# Patient Record
Sex: Female | Born: 1940 | Race: White | Hispanic: No | Smoking: Never smoker
Health system: Southern US, Community
[De-identification: ages and names within clinical notes are randomized; demographics above are authoritative.]

## PROBLEM LIST (undated history)

## (undated) DIAGNOSIS — E785 Hyperlipidemia, unspecified: Secondary | ICD-10-CM

## (undated) DIAGNOSIS — M4800 Spinal stenosis, site unspecified: Secondary | ICD-10-CM

## (undated) DIAGNOSIS — I2510 Atherosclerotic heart disease of native coronary artery without angina pectoris: Secondary | ICD-10-CM

## (undated) DIAGNOSIS — I10 Essential (primary) hypertension: Secondary | ICD-10-CM

## (undated) DIAGNOSIS — E079 Disorder of thyroid, unspecified: Secondary | ICD-10-CM

## (undated) HISTORY — DX: Disorder of thyroid, unspecified: E07.9

## (undated) HISTORY — DX: Hyperlipidemia, unspecified: E78.5

## (undated) HISTORY — PX: THYROID SURGERY: SHX805

## (undated) HISTORY — PX: TUBAL LIGATION: SHX77

## (undated) HISTORY — DX: Atherosclerotic heart disease of native coronary artery without angina pectoris: I25.10

## (undated) HISTORY — DX: Essential (primary) hypertension: I10

## (undated) HISTORY — DX: Spinal stenosis, site unspecified: M48.00

---

## 1999-07-15 ENCOUNTER — Encounter: Admission: RE | Admit: 1999-07-15 | Discharge: 1999-07-15 | Payer: Self-pay | Admitting: Obstetrics and Gynecology

## 1999-07-15 ENCOUNTER — Encounter: Payer: Self-pay | Admitting: Obstetrics and Gynecology

## 2000-07-23 ENCOUNTER — Encounter: Payer: Self-pay | Admitting: Obstetrics and Gynecology

## 2000-07-23 ENCOUNTER — Encounter: Admission: RE | Admit: 2000-07-23 | Discharge: 2000-07-23 | Payer: Self-pay | Admitting: Obstetrics and Gynecology

## 2001-08-30 ENCOUNTER — Encounter: Admission: RE | Admit: 2001-08-30 | Discharge: 2001-08-30 | Payer: Self-pay | Admitting: Obstetrics and Gynecology

## 2001-08-30 ENCOUNTER — Encounter: Payer: Self-pay | Admitting: Obstetrics and Gynecology

## 2002-03-07 ENCOUNTER — Other Ambulatory Visit: Admission: RE | Admit: 2002-03-07 | Discharge: 2002-03-07 | Payer: Self-pay | Admitting: Obstetrics and Gynecology

## 2002-06-27 ENCOUNTER — Encounter: Admission: RE | Admit: 2002-06-27 | Discharge: 2002-09-25 | Payer: Self-pay | Admitting: Family Medicine

## 2002-09-30 ENCOUNTER — Encounter: Admission: RE | Admit: 2002-09-30 | Discharge: 2002-12-29 | Payer: Self-pay | Admitting: Family Medicine

## 2002-10-11 ENCOUNTER — Encounter: Admission: RE | Admit: 2002-10-11 | Discharge: 2002-10-11 | Payer: Self-pay | Admitting: Family Medicine

## 2002-10-11 ENCOUNTER — Encounter: Payer: Self-pay | Admitting: Family Medicine

## 2003-01-25 ENCOUNTER — Encounter: Admission: RE | Admit: 2003-01-25 | Discharge: 2003-04-25 | Payer: Self-pay | Admitting: Family Medicine

## 2003-03-01 ENCOUNTER — Other Ambulatory Visit: Admission: RE | Admit: 2003-03-01 | Discharge: 2003-03-01 | Payer: Self-pay | Admitting: Obstetrics and Gynecology

## 2003-03-03 ENCOUNTER — Ambulatory Visit (HOSPITAL_COMMUNITY): Admission: RE | Admit: 2003-03-03 | Discharge: 2003-03-03 | Payer: Self-pay | Admitting: Obstetrics and Gynecology

## 2003-04-03 ENCOUNTER — Encounter (INDEPENDENT_AMBULATORY_CARE_PROVIDER_SITE_OTHER): Payer: Self-pay | Admitting: *Deleted

## 2003-04-03 ENCOUNTER — Ambulatory Visit (HOSPITAL_COMMUNITY): Admission: RE | Admit: 2003-04-03 | Discharge: 2003-04-03 | Payer: Self-pay | Admitting: Surgery

## 2003-04-06 ENCOUNTER — Ambulatory Visit (HOSPITAL_BASED_OUTPATIENT_CLINIC_OR_DEPARTMENT_OTHER): Admission: RE | Admit: 2003-04-06 | Discharge: 2003-04-06 | Payer: Self-pay | Admitting: Orthopedic Surgery

## 2003-04-06 ENCOUNTER — Ambulatory Visit (HOSPITAL_COMMUNITY): Admission: RE | Admit: 2003-04-06 | Discharge: 2003-04-06 | Payer: Self-pay | Admitting: Orthopedic Surgery

## 2003-05-17 ENCOUNTER — Encounter: Admission: RE | Admit: 2003-05-17 | Discharge: 2003-08-15 | Payer: Self-pay | Admitting: Family Medicine

## 2003-06-01 ENCOUNTER — Observation Stay (HOSPITAL_COMMUNITY): Admission: RE | Admit: 2003-06-01 | Discharge: 2003-06-02 | Payer: Self-pay | Admitting: Orthopaedic Surgery

## 2003-06-01 ENCOUNTER — Encounter (INDEPENDENT_AMBULATORY_CARE_PROVIDER_SITE_OTHER): Payer: Self-pay | Admitting: *Deleted

## 2003-08-16 ENCOUNTER — Encounter: Admission: RE | Admit: 2003-08-16 | Discharge: 2003-11-14 | Payer: Self-pay | Admitting: Family Medicine

## 2003-11-30 ENCOUNTER — Encounter: Admission: RE | Admit: 2003-11-30 | Discharge: 2004-02-28 | Payer: Self-pay | Admitting: Family Medicine

## 2004-05-01 ENCOUNTER — Encounter: Admission: RE | Admit: 2004-05-01 | Discharge: 2004-07-30 | Payer: Self-pay | Admitting: Family Medicine

## 2004-05-13 ENCOUNTER — Other Ambulatory Visit: Admission: RE | Admit: 2004-05-13 | Discharge: 2004-05-13 | Payer: Self-pay | Admitting: Obstetrics and Gynecology

## 2004-11-11 ENCOUNTER — Ambulatory Visit (HOSPITAL_COMMUNITY): Admission: RE | Admit: 2004-11-11 | Discharge: 2004-11-11 | Payer: Self-pay | Admitting: Gastroenterology

## 2005-03-11 ENCOUNTER — Encounter: Admission: RE | Admit: 2005-03-11 | Discharge: 2005-06-09 | Payer: Self-pay | Admitting: Family Medicine

## 2005-05-29 ENCOUNTER — Other Ambulatory Visit: Admission: RE | Admit: 2005-05-29 | Discharge: 2005-05-29 | Payer: Self-pay | Admitting: Obstetrics and Gynecology

## 2006-03-05 ENCOUNTER — Ambulatory Visit: Payer: Self-pay | Admitting: Family Medicine

## 2006-03-12 ENCOUNTER — Ambulatory Visit: Payer: Self-pay | Admitting: Family Medicine

## 2006-03-26 ENCOUNTER — Ambulatory Visit: Payer: Self-pay | Admitting: Family Medicine

## 2006-04-09 ENCOUNTER — Ambulatory Visit: Payer: Self-pay | Admitting: Family Medicine

## 2006-04-23 ENCOUNTER — Ambulatory Visit: Payer: Self-pay | Admitting: Family Medicine

## 2006-05-07 ENCOUNTER — Ambulatory Visit: Payer: Self-pay | Admitting: Family Medicine

## 2006-05-21 ENCOUNTER — Ambulatory Visit: Payer: Self-pay | Admitting: Family Medicine

## 2006-06-04 ENCOUNTER — Ambulatory Visit: Payer: Self-pay | Admitting: Family Medicine

## 2006-07-02 ENCOUNTER — Ambulatory Visit: Payer: Self-pay | Admitting: Family Medicine

## 2006-07-30 ENCOUNTER — Ambulatory Visit: Payer: Self-pay | Admitting: Family Medicine

## 2006-09-03 ENCOUNTER — Ambulatory Visit: Payer: Self-pay | Admitting: Family Medicine

## 2006-09-11 ENCOUNTER — Encounter: Payer: Self-pay | Admitting: Family Medicine

## 2006-10-01 ENCOUNTER — Ambulatory Visit: Payer: Self-pay | Admitting: Family Medicine

## 2006-11-09 LAB — CONVERTED CEMR LAB
AST: 19 units/L (ref 0–37)
Albumin: 4.4 g/dL (ref 3.5–5.2)
Alkaline Phosphatase: 56 units/L (ref 39–117)
BUN: 20 mg/dL (ref 6–23)
Potassium: 4.7 meq/L (ref 3.5–5.3)
Sodium: 139 meq/L (ref 135–145)
Total Protein: 7.1 g/dL (ref 6.0–8.3)

## 2006-11-24 ENCOUNTER — Ambulatory Visit: Payer: Self-pay | Admitting: Family Medicine

## 2006-12-22 ENCOUNTER — Ambulatory Visit: Payer: Self-pay | Admitting: Family Medicine

## 2007-01-26 ENCOUNTER — Ambulatory Visit: Payer: Self-pay | Admitting: Family Medicine

## 2007-02-16 ENCOUNTER — Ambulatory Visit: Payer: Self-pay | Admitting: Family Medicine

## 2007-03-03 ENCOUNTER — Encounter: Payer: Self-pay | Admitting: Family Medicine

## 2007-03-16 ENCOUNTER — Ambulatory Visit: Payer: Self-pay | Admitting: Family Medicine

## 2007-04-13 ENCOUNTER — Ambulatory Visit: Payer: Self-pay | Admitting: Family Medicine

## 2007-07-13 ENCOUNTER — Ambulatory Visit: Payer: Self-pay | Admitting: Family Medicine

## 2007-09-28 ENCOUNTER — Ambulatory Visit: Payer: Self-pay | Admitting: Family Medicine

## 2007-11-17 ENCOUNTER — Ambulatory Visit: Payer: Self-pay | Admitting: Vascular Surgery

## 2007-11-23 ENCOUNTER — Ambulatory Visit: Payer: Self-pay | Admitting: Family Medicine

## 2008-02-16 ENCOUNTER — Ambulatory Visit: Payer: Self-pay | Admitting: Vascular Surgery

## 2008-02-29 ENCOUNTER — Ambulatory Visit: Payer: Self-pay | Admitting: Family Medicine

## 2008-03-08 ENCOUNTER — Ambulatory Visit: Payer: Self-pay | Admitting: Vascular Surgery

## 2008-03-13 ENCOUNTER — Ambulatory Visit: Payer: Self-pay | Admitting: Vascular Surgery

## 2008-08-16 ENCOUNTER — Ambulatory Visit: Payer: Self-pay | Admitting: Family Medicine

## 2008-09-20 ENCOUNTER — Ambulatory Visit: Payer: Self-pay | Admitting: Family Medicine

## 2008-10-12 ENCOUNTER — Ambulatory Visit (HOSPITAL_COMMUNITY): Admission: RE | Admit: 2008-10-12 | Discharge: 2008-10-12 | Payer: Self-pay | Admitting: Obstetrics and Gynecology

## 2008-10-12 ENCOUNTER — Encounter (INDEPENDENT_AMBULATORY_CARE_PROVIDER_SITE_OTHER): Payer: Self-pay | Admitting: Obstetrics and Gynecology

## 2010-06-10 ENCOUNTER — Other Ambulatory Visit: Payer: Self-pay | Admitting: Orthopaedic Surgery

## 2010-06-10 DIAGNOSIS — M549 Dorsalgia, unspecified: Secondary | ICD-10-CM

## 2010-06-12 ENCOUNTER — Ambulatory Visit
Admission: RE | Admit: 2010-06-12 | Discharge: 2010-06-12 | Disposition: A | Discharge status: Home / Self Care | Payer: Medicare Other | Source: Ambulatory Visit | Attending: Orthopaedic Surgery | Admitting: Orthopaedic Surgery

## 2010-06-12 DIAGNOSIS — M549 Dorsalgia, unspecified: Secondary | ICD-10-CM

## 2010-06-30 LAB — CBC
Hemoglobin: 13 g/dL (ref 12.0–15.0)
MCHC: 34.6 g/dL (ref 30.0–36.0)
RDW: 12.3 % (ref 11.5–15.5)

## 2010-07-17 ENCOUNTER — Other Ambulatory Visit: Payer: Self-pay | Admitting: Obstetrics and Gynecology

## 2010-07-17 DIAGNOSIS — N6314 Unspecified lump in the right breast, lower inner quadrant: Secondary | ICD-10-CM

## 2010-07-18 DIAGNOSIS — E669 Obesity, unspecified: Secondary | ICD-10-CM

## 2010-07-24 ENCOUNTER — Ambulatory Visit
Admission: RE | Admit: 2010-07-24 | Discharge: 2010-07-24 | Disposition: A | Discharge status: Home / Self Care | Payer: Medicare Other | Source: Ambulatory Visit | Attending: Obstetrics and Gynecology | Admitting: Obstetrics and Gynecology

## 2010-07-24 ENCOUNTER — Other Ambulatory Visit: Payer: Self-pay | Admitting: Obstetrics and Gynecology

## 2010-07-24 DIAGNOSIS — N6314 Unspecified lump in the right breast, lower inner quadrant: Secondary | ICD-10-CM

## 2010-08-06 NOTE — Consult Note (Signed)
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  She is well-known to me from her long time work as a  Engineer, civil (consulting) in the [employer]  She reports that she is having  increasingly severe discomfort over her left calf and pain and aching  sensation in her left calf and distal ankle.  She reports she elevates  her legs when possible.  She has not worn compression garments.  She has  not had any history of bleeding from her varicosities and does not have  any history of deep venous thrombosis or superficial thrombophlebitis.  She reports that the pain and swelling make it difficult for her to walk  which she likes to do for exercise and also causes difficulty in  prolonged standing and driving a car.  She reports this has been  somewhat worse in the past several months.  She does have a family  history of varicosities in her father.  She does have a history of  parathyroidectomy in 2005 and anterior cervical fusion in 2005.   MEDICATIONS:  Are Keflex and Butazolidin.   PHYSICAL EXAMINATION:  General:  A well-developed, well-nourished white  female appearing her stated age of 74.  Vital signs:  Blood pressure is  153/91, pulse 64, respirations 18.  Extremities:  Her dorsalis pedis  pulse are 2+ bilaterally.  She does have no evidence of varicosities on  her right leg with some spider vein telangiectasia at the medial knee.  On the left she does have marked tributary varicosities in her medial  calf.   She underwent handheld screening duplex by me which shows reflux in her  saphenous vein extending into these varicosities.  I discussed options  with the patient.  She has not worn compression garments in the past and  we have fitted her today with these thigh-high 20-30 mmHg garments.  She  will  continue with this and we will see her again in 3 months to  determine if elevation of her legs, ibuprofen for discomfort and  compression garments are helping her.  I did discuss the option of laser  ablation and stab phlebectomy for symptom relief should she fail  conservative treatment.  We will discuss this with her further in 3  months.   Larina Earthly, M.D.  Electronically Signed   TFE/MEDQ  D:  11/17/2007  T:  11/18/2007  Job:  [id number]

## 2010-08-06 NOTE — Assessment & Plan Note (Signed)
OFFICE VISIT   Melinda, Pittman  DOB:  [birth year]                                       02/16/2008  ZOXWR#:[id number]   The patient presents today for continued discussion regarding her left  leg venous pathology.  She reports that she has had no improvement from  wearing her graduated compression garments since my last visit with her  in August of 2009.  She works as a Engineer, civil (consulting) with long shifts and does a  great deal of standing with this.  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  She also reports this  impacts her ability to add walking in aerobic activity due to leg pain.  Her physical exam is noted for marked large varicosities throughout her  left calf.  She underwent a formal venous duplex today and this does  show reflux in her great saphenous vein and also her small saphenous  vein.  The deep system is without DVT or reflux.  The majority of these  varicosities appear to be arising directly from the small saphenous  vein.  I discussed this at length with the patient.  I have suggested  that we proceed with laser ablation of her left small saphenous vein and  stab phlebectomy of her multiple tributary varicosities.  I explained  the procedure including expected recovery.  I did explain the potential  complications of deep venous thrombosis and non-closure of the lesser  saphenous vein.  She understands and wishes to proceed as soon as we can  get her on the schedule.   Larina Earthly, M.D.  Electronically Signed   TFE/MEDQ  D:  02/16/2008  T:  02/21/2008  Job:  [id number]

## 2010-08-06 NOTE — H&P (Signed)
NAMEFRIMY, UFFELMAN NO.:  [id number]   MEDICAL RECORD NO.:  [id number]          PATIENT TYPE:  AMB   LOCATION:  SDC                           FACILITY:  [facility]   PHYSICIAN:  Juluis Mire, M.D.   DATE OF BIRTH:  08-25-1940   DATE OF ADMISSION:  DATE OF DISCHARGE:                              HISTORY & PHYSICAL   The patient is a 70 year old postmenopausal patient, who presents for  hysteroscopy.  The patient did re-experience some past postmenopausal  bleeding.  We did a saline infusion ultrasound.  She had endometrial  thickness of 5.8-mm.  There were no adnexal masses noted because of  extremely stenotic cervix, we actually could not complete the saline  infusion ultrasound or endometrial sampling because of this, she now  proceeds for hysteroscopic evaluation just to rule out any type of  endometrial pathology.   In terms of allergies, she is allergic to [facility] and BUTAZOLIDIN.   MEDICATIONS:  1. She is on Synthroid 150 mcg daily.  2. Vagifem suppositories and numerous vitamins and supplements.   PAST MEDICAL HISTORY:  She has usual childhood diseases.  No significant  sequelae.  Does have a history of arthritis and diverticulosis, and is  on medication for hypothyroidism.   PAST SURGICAL HISTORY:  She has had a tubal ligations 75.  Had a right  thyroidectomy in 1978 and had a total thyroidectomy in 2005.  She has  had anterior cervical fusion of C5 and C7 in 2005.  Carpal tunnel  surgery in 2005.  She has had 2 vaginal deliveries and 1 D and C for a  miscarriage.   In terms of family history, family history of heart disease and  osteoporosis.   SOCIAL HISTORY:  Reveals no tobacco and occasional alcohol use.   REVIEW OF SYSTEMS:  Noncontributory.   PHYSICAL EXAMINATION:  GENERAL:  The patient afebrile.  VITAL SIGNS:  Stable.  HEENT:  The patient is normocephalic.  Pupils equal, round, and reactive  to light and accommodation.  Extraocular movements  were intact.  Sclerae  and conjunctivae are clear.  Thyroid surgically removed.  BREASTS:  Not examined.  LUNGS:  Clear.  CARDIAC SYSTEM:  Regular rhythm rate without murmurs or gallops.  ABDOMEN:  Benign.  No mass, organomegaly, or tenderness.  PELVIC:  Normal external genitalia.  Vaginal mucosa is clear.  Cervix  unremarkable.  Uterus normal size, shape, and contour.  Adnexa free of  mass or tenderness.  EXTREMITIES:  Trace edema.  NEUROLOGIC:  Grossly within normal limits.   IMPRESSION:  1. Postmenopausal bleeding with stenotic cervix.  2. Hypothyroidism after prior thyroidectomy.   PLAN:  The patient undergo hysteroscopy D and C.  The nature of the  procedure, I have discussed.  The risk, I have explained.  The risk  include the risk of infection.  The risk of vascular injury could lead  to hemorrhage requiring transfusion with the risk of AIDS or hepatitis.  Risk of excessive bleeding could lead to hysterectomy.  There is a risk  of perforation leading to injury to adjacent organs such as bowel  requiring further exploratory surgery.  Risk of deep venous thrombosis  and some pulmonary embolus.  The patient expressed understanding of the  indications and risks.      Juluis Mire, M.D.  Electronically Signed     JSM/MEDQ  D:  10/12/2008  T:  10/12/2008  Job:  [id number]

## 2010-08-06 NOTE — Assessment & Plan Note (Signed)
OFFICE VISIT   [name], [name]  DOB:  Jul 12, 1940                                       03/13/2008  ZOXWR#:[id number]   The patient had laser ablation of her left small vein with multiple stab  phlebectomies performed by Dr. Arbie Cookey on December 16th.  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  She has had  no distal edema.  Venous duplex exam today revealed no evidence of deep  venous obstruction and complete occlusion of the left small saphenous  vein, up to the saphenopopliteal junction, with no popliteal vein  thrombosis.  On exam, she has some mild ecchymosis around the stab  phlebectomy sites, particularly medially below the knee and minimal  tenderness along the small saphenous vein posteriorly with no distal  edema.  She was reassured regarding these findings, will return in 6-8  weeks to further discuss her situation with Dr. Arbie Cookey regarding possible  left great saphenous vein ablation in the future.   [name], M.D.  Electronically Signed   JDL/MEDQ  D:  03/13/2008  T:  03/14/2008  Job:  [id number]

## 2010-08-06 NOTE — Op Note (Signed)
NAMETRISA, CRANOR            ACCOUNT NO.:  [id number]   MEDICAL RECORD NO.:  [id number]          PATIENT TYPE:  AMB   LOCATION:  SDC                           FACILITY:  [facility]   PHYSICIAN:  Juluis Mire, M.D.   DATE OF BIRTH:  Jun 24, 1940   DATE OF PROCEDURE:  DATE OF DISCHARGE:  10/12/2008                               OPERATIVE REPORT   PREOPERATIVE DIAGNOSIS:  Postmenopausal bleeding with stenotic cervix.   POSTOPERATIVE DIAGNOSIS:  Postmenopausal bleeding with stenotic cervix.   PROCEDURE:  Paracervical block.  Hysteroscopy with dilation and  curettage.   SURGEON:  Juluis Mire, MD   ANESTHESIA:  Paracervical block and MAC anesthesia.   ESTIMATED BLOOD LOSS:  Minimal.   PACKS AND DRAINS:  None.   INTRAOPERATIVE BLOOD PLACED:  None.   COMPLICATIONS:  None.   INDICATIONS:  Dictated in history and physical.   PROCEDURE:  The patient was taken to OR, placed in supine position.  After satisfactory level of sedation, she was placed in dorsal lithotomy  position using Allen stirrups.  The patient was draped in a sterile  field, speculum was placed in vaginal vault.  The cervix and vagina  cleansed with Betadine.  Cervix was grasped with single-tooth tenaculum.  A paracervical block was introduced using 1% Nesacaine.  Cervix looked  extremely stenotic.  Attempts of dilatation with a regular Pratt  dilators were unsuccessful.  A single-tooth tenaculum did pull through  the cervix.  We replaced it with a Gerilyn Pilgrim sac and we brought in lacrimal  probes.  We were able to go ahead and start dilating the cervix with  these, and we were able to progressively dilate it.  We then inserted  the hysteroscope.  Visualization did reveal a fundal polyp.  This was  resected and sent for pathology.  We also did endometrial samplings and  curettings.  Total deficit was 5 mL.  At this point in time, the  hysteroscope, single-tooth tenaculum, and speculum were removed.  The  patient taken  out of dorsal lithotomy position.  Once alert, transferred  to recovery room in good condition.  Sponge, instrument, and needle  count was correct by circulating nurse.      Juluis Mire, M.D.  Electronically Signed    JSM/MEDQ  D:  10/12/2008  T:  10/13/2008  Job:  [id number]

## 2010-08-06 NOTE — Procedures (Signed)
LOWER EXTREMITY VENOUS REFLUX EXAM   INDICATION:  Left lower extremity varicose veins.   EXAM:  Using color-flow imaging and pulse Doppler spectral analysis, the  left common femoral, superficial femoral, popliteal, posterior tibial,  greater and lesser saphenous veins are evaluated.  There is evidence  suggesting deep venous insufficiency in the popliteal vein.   The left saphenofemoral junction is not competent.  The left GSV is not  competent with the caliber as described below.   The left proximal short saphenous vein demonstrates incompetency with  diameter measurements ranging from 0.42 to 1.2 cm.   GSV Diameter (used if found to be incompetent only)                                            Right    Left  Proximal Greater Saphenous Vein           cm       0.64 cm  Proximal-to-mid-thigh                     cm       0.42 cm  Mid thigh                                 cm       0.39 cm  Mid-distal thigh                          cm       0.45 cm  Distal thigh                              cm       0.45 cm  Knee                                      cm       0.36 cm   IMPRESSION:  1. Left greater saphenous vein reflux is identified with caliber      measurements noted as described above.  2. The left greater saphenous vein is not aneurysmal or tortuous.  3. The left deep venous system is not competent.  4. The left lesser saphenous vein is not competent.   ___________________________________________  Larina Earthly, M.D.   CH/MEDQ  D:  02/16/2008  T:  02/16/2008  Job:  [id number]

## 2010-08-06 NOTE — Procedures (Signed)
DUPLEX DEEP VENOUS EXAM - LOWER EXTREMITY   INDICATION:  Status post left short saphenous vein ablation.   HISTORY:  Edema:  No.  Trauma/Surgery:  Status post laser ablation of left short saphenous vein  03/08/2008 by Dr. Arbie Cookey.  Pain:  Minimal.  PE:  No.  Previous DVT:  No.  Anticoagulants:  No.  Other:   DUPLEX EXAM:                CFV   SFV   PopV  PTV    GSV                R  L  R  L  R  L  R   L  R  L  Thrombosis    0  0     0     0      0     0  Spontaneous   +  +     +     +      +     +  Phasic        +  +     +     +      +     +  Augmentation  +  +     +     +      +     +  Compressible  +  +     +     +      +     +  Competent     +  +     +     +      +     0   Legend:  + - yes  o - no  p - partial  D - decreased   IMPRESSION:  1. No evidence of DVT in the left lower extremity or the right common      femoral vein.  2. Evidence of ablation without flow in left short saphenous vein.  3. Thrombus in left short saphenous vein extends to the popliteal vein      without extending into it.  Thrombus noted in left calf varicose      vein.  4. Known left greater saphenous vein insufficiency.        _____________________________  [name], M.D.   AS/MEDQ  D:  03/13/2008  T:  03/13/2008  Job:  [phone]

## 2010-08-09 NOTE — Op Note (Signed)
NAMEALEXANDRINA, [name]            ACCOUNT NO.:  [id number]   MEDICAL RECORD NO.:  [id number]          PATIENT TYPE:  AMB   LOCATION:  ENDO                         FACILITY:  [facility]   PHYSICIAN:  Bernette Redbird, M.D.   DATE OF BIRTH:  03/15/1941   DATE OF PROCEDURE:  11/11/2004  DATE OF DISCHARGE:                                 OPERATIVE REPORT   PROCEDURE:  Colonoscopy.   INDICATIONS:  Initial screening examination in an asymptomatic 70 year old  female without risk factors .   FINDINGS:  Sigmoid diverticulosis.   PROCEDURE:  The nature, purpose, and risks of procedure been reviewed with  the patient and provided written consent. Sedation was fentanyl 75 mcg and  Versed 7 mg IV without arrhythmias or desaturation. The Olympus pediatric  adjustable tension video colonoscope was advanced to the terminal ileum with  slight difficulty through a somewhat angulated sigmoid region, and pullback  was then performed. The quality of the prep was excellent and it was felt  that all areas were well seen.   The terminal ileum had a normal appearance, as did the colon except in the  sigmoid region where there was a small amount of diverticular change. No  polyps, cancer, colitis or vascular ectasia were noted and retroflexion in  the rectum and reinspection of the rectum were unremarkable. No biopsies  were obtained. The patient tolerated the procedure well and there no  apparent complications.   IMPRESSION:  1.  Screening colonoscopy without worrisome findings in a standard risk      individual (V76.51).  2.  Sigmoid diverticulosis.   PLAN:  Repeat colonoscopy in 10 years.           ______________________________  Bernette Redbird, M.D.     RB/MEDQ  D:  11/11/2004  T:  11/11/2004  Job:  [id number]   cc:   Donia Guiles, M.D.  [address] [place]  [place] [address]  Fax: [phone]   Juluis Mire, M.D.  [address] [place]  [place] [address]  Fax: [phone]

## 2010-08-09 NOTE — Op Note (Signed)
NAMEJULITZA, [name] 04/06/2002             ACCOUNT NO.:  [id number]   MEDICAL RECORD NO.:  [id number]                   PATIENT TYPE:  AMB   LOCATION:  DSC                                  FACILITY:  [facility]   PHYSICIAN:  Dionne Ano. Everlene Other, M.D.         DATE OF BIRTH:  12/27/1940   DATE OF PROCEDURE:  DATE OF DISCHARGE:                                 OPERATIVE REPORT   PREOPERATIVE DIAGNOSES:  1. Left carpal tunnel syndrome.  2. Left base of thumb joint  arthritis.  3. Left lateral  epicondylitis.   POSTOPERATIVE DIAGNOSES:  1. Left carpal tunnel syndrome.  2. Left base of thumb joint  arthritis.  3. Left lateral  epicondylitis.   PROCEDURE:  1. Left limited open carpal tunnel release.  2. Left median nerve and field block (peripheral nerve block), at  the wrist     level  for anesthetic purposes for carpal tunnel release. This was a     peripheral nerve block.  3. Left CMC joint injection with Celestone and Marcaine derivative.  4. Left elbow injection with Celestone, Lidocaine and Marcaine secondary to     lateral epicondylitis.   SURGEON:  Dionne Ano. Amanda Pea, M.D.   ASSISTANT:  None.   COMPLICATIONS:  None.   ANESTHESIA:  Peripheral nerve block with light IV sedation, keeping the  patient awake, alert and oriented the entire case for the carpal tunnel  release.   TOURNIQUET TIME:  Less than 10 minutes.   ESTIMATED BLOOD LOSS:  Minimal.   COMPLICATIONS:  None.   INDICATIONS FOR PROCEDURE:  [name] is a  very pleasant 70 year old  female who presents with the above mentioned diagnosis. I have counseled her  in regards to the risks and benefits of surgery including  the risks of  infection, bleeding, anesthesia, damage to normal structures and failure of  the surgery to accomplish its intended goals of relieving symptoms and  restoring function. With this in mind she desires to proceed. All questions  have been encouraged and answered preoperatively.   DESCRIPTION OF PROCEDURE:  The patient was seen by myself and anesthesia.  She was taken to the operative suite. The correct extremity to be operated  on and the permit were checked and signed previously. Once this was done in  the operative suite she was given 1 of Fentanyl and 1 of Versed. She  underwent a median nerve and field block at the wrist level for anesthetic  purposes for carpal tunnel release. This was done by myself and was a  peripheral nerve block at the wrist, distal forearm level. Once adequate  anesthesia was obtained with a peripheral nerve block and with the patient  awake, the patient was prepped and draped in the usual sterile fashion with  Betadine scrub and paint.   Following  this I made an  incision and under 350 mm tourniquet control at  the distal edge of the transverse carpal ligament, dissected down  and  identified  the  palmar fascia. I incised this and  then released the distal  edge of the transverse carpal ligament under 4 point C loupe  magnification.   Following  this the patient then underwent a distal  to proximal dissection  until room was available for a canal preparatory device. The canal  preparatory devices 1, 2 and 3 were placed directly midline without patient  discomfort or problems. Following  this a security clip was placed just  under the proximal median leaflet of the transverse carpal ligament and the  obturator was disengaged. A security knife was placed in the security clip,  effectively releasing the proximal leaflet of the transverse carpal  ligament.   I then checked the canal. There were no space occupying lesions, masses or  other  problems. She tolerated this well. She was awake, alert and oriented  during this. There was no median nerve injury and she  had full release  distally and proximally  as checked under 4 point C loupe magnification.  There were no aberrant median nerve branches. I was pleased with this and  the  findings.   Following  this the patient then underwent copious irrigation, tourniquet  deflation and hemostasis was achieved with bipolar electrocautery. Following  this the patient then had the wound closed with interrupted 4-0 Prolene x3  sutures. Once this was done she was then given a heavier IV sedation and  underwent CMC injection with 1 mL of Celestone and 1 mL of Marcaine.   Following  injection in the [facility] joint under sterile conditions, I injected  the elbow under sterile conditions as well with 1 mL of Celestone, 1 mL of  Lidocaine and 1 mL of Marcaine. This was placed just off of the lateral  epicondyle where the extensor carpi radialis brevis origin is. I peppered  the bone and placed the injection deeply to prevent depigmentation. She  tolerated  the procedure well and there were no complicating features.   Following this, she had a sterile dressing applied and the drapes were  removed. A thumb spica splint was placed and she was then transferred to the  recovery room. All sponge, instrument and needle counts were reported as  correct and there were no complicating features.   She will return to the office to see [place] in 7 days. She will see therapy in  14 days and notify me should any problems occur. Lortab and  Robaxin were  written postoperatively for pain relief and muscle spasm respectively.                                               Dionne Ano. Everlene Other, M.D.    Nash Mantis  D:  04/06/2003  T:  04/06/2003  Job:  [id number]

## 2010-08-09 NOTE — Op Note (Signed)
NAMEDIEDRE, [name] NO.:  [id number]   MEDICAL RECORD NO.:  [id number]                   PATIENT TYPE:  INP   LOCATION:  5018                                 FACILITY:  [facility]   PHYSICIAN:  Sharolyn Douglas, M.D.                     DATE OF BIRTH:  1940-11-12   DATE OF PROCEDURE:  06/01/2003  DATE OF DISCHARGE:                                 OPERATIVE REPORT   PREOPERATIVE DIAGNOSIS:  Cervical spinal stenosis, C5-6, C6-7.   POSTOPERATIVE DIAGNOSIS:  Cervical spinal stenosis, C5-6, C6-7.   OPERATION PERFORMED:  1. Anterior cervical diskectomy C5-6, C6-7 with decompression of the spinal     cord and nerve roots bilaterally.  2. Anterior cervical arthrodesis, C5-6 and C6-7 with placement of two     allograft prosthesis spacers packed with local autogenous bone graft.  3. Anterior cervical plating utilizing the [employer] system C5 through     C7.   SURGEON:  Sharolyn Douglas, M.D.   ASSISTANT:  Verlin Fester, P.A.   ANESTHESIA:  General endotracheal.   COMPLICATIONS:  None.   INDICATIONS FOR PROCEDURE:  The patient is a 70 year old female with  cervical spinal stenosis at C5-6 and C6-7 secondary to disk osteophyte  complex.  I have discussed surgery with her at length to decompress her  spinal canal.  She is planning a thyroidectomy with Dr. Gerrit Friends and would  like to have the procedures combined.  In addition, Dr. Amanda Pea will be  releasing her right carpal tunnel.  Risks, benefits and alternatives of ACDI  were reviewed with the patient.  She elected to proceed.   DESCRIPTION OF PROCEDURE:  The neck was prepped and draped in the usual  sterile fashion by Dr. Gerrit Friends.  Care was taken to avoid hyperextension of  her neck.  Dr. Gerrit Friends then performed a total thyroidectomy through an  anterior incision.  He then proceeded to close the strap muscles in the  midline.  I then performed an exposure of the anterior cervical spine  through a separate fascial  incision.  On the left side of the neck the  interval between the sternocleidomastoid muscle and strap muscles was  developed down to the prevertebral space.  The C6-7 disk space was easily  identifiable by the large anterior osteophytes.  The esophagus, trachea and  carotid sheath were identified and protected at all times.  A spinal needle  was placed at C5-6 and intraoperative x-ray confirmed our position.  Deep  retractors were placed.  The longus colli muscle was elevated out over the  C5-6, C6-7 disk spaces bilaterally.  A deep retractor was placed.  The  microscope was draped and brought into the field.  The anterior osteophytes  were removed with the Leksell rongeur.  Starting at C6-7, a diskectomy was  carried back to the posterior longitudinal ligament.  The disk space was  degenerative.  There  was very little disk material present.  The posterior  longitudinal ligament was taken down.  Partially calcified disk material was  decompressed from the spinal canal.  Foraminotomies were carried out  bilaterally.  Bleeding was controlled with Flow-Seal and Gelfoam.  We then  placed a 7 mm allograft prosthesis spacer which had been packed with local  autogenous bone graft from the bur shavings into the interspace, carefully  countersunk at 1 mm.  A similar procedure was then carried out at C5-6.  Again the posterior longitudinal ligament was taken down and disk material  decompressed from the epidural space which had migrated into a  subligamentous position. Foraminotomies were carried out bilaterally.  Curets were used to scrape the cartilaginous end plates.  7 mm allograft  prosthesis spacer was then packed with local autogenous bone graft,  carefully impacted into position, 1 mm countersunk.  An anterior cervical  plate was then placed in the midline.  We utilized six 12 mm screws, we had  excellent screw purchase, we ensured the locking mechanism engaged.  The  wound was copiously  irrigated.  We inspected the esophagus, trachea, carotid  sheath.  There were no apparent injuries.  A deep Penrose drain was left in  position.  Intraoperative x-ray showed appropriate levels, good positioning  of the plate and graft although we could not completely visualize the C6-7  level.  Platysma was closed with an interrupted 2-0 Vicryl.  Subcutaneous  layer closed with interrupted 3-0 Vicryl followed by a running 4-0  subcuticular Vicryl suture on the skin.  Benzoin and Steri-Strips were  placed.  The patient was placed into a soft collar.  She was then positioned  by Dr. Amanda Pea for release of her right carpal tunnel.                                               Sharolyn Douglas, M.D.    MC/MEDQ  D:  06/01/2003  T:  06/02/2003  Job:  [id number]

## 2010-08-09 NOTE — Op Note (Signed)
NAMEJENAE, [name] NO.:  [id number]   MEDICAL RECORD NO.:  [id number]                   PATIENT TYPE:  INP   LOCATION:  5018                                 FACILITY:  [facility]   PHYSICIAN:  Dionne Ano. Everlene Other, M.D.         DATE OF BIRTH:  28-Mar-1940   DATE OF PROCEDURE:  06/01/2003  DATE OF DISCHARGE:  06/02/2003                                 OPERATIVE REPORT   PREOPERATIVE DIAGNOSIS:  Right carpal tunnel syndrome and left de Quervain's  tenosynovitis with [facility] joint degenerative changes.   POSTOPERATIVE DIAGNOSIS:  Right carpal tunnel syndrome and left de  Quervain's tenosynovitis with CMC joint degenerative changes.   PROCEDURE:  1. Right carpal tunnel release.  2. Left CMC and first dorsal compartment injection with lidocaine and     Marcaine derivative.   SURGEON:  Dionne Ano. Amanda Pea, M.D.   ASSISTANT:  None.   COMPLICATIONS:  None.   ANESTHESIA:  General.   INDICATIONS FOR PROCEDURE:  Ms. [name] presents with the above mentioned  diagnosis.  I have counseled her in regards to the risks and benefits of  surgery including the risks of infection, bleeding, anesthesia, damage to  normal structures, and failure of surgery to accomplish its intended goal of  relieving symptoms.  With this in mind, she desires to proceed.  All  questions were encouraged and answered preoperatively.   PROCEDURE IN DETAIL:  The patient was seen by anesthesia.  She underwent a  cervical discectomy and fusion by Dr. Sharolyn Douglas and also underwent a thyroid  removal by Dr. Gerrit Friends.  Following this, the patient had attention placed  towards her upper extremities.  I examined her under anesthesia, checked  consent, permit, etc., and then prepped and draped the right upper extremity  with Betadine scrub and paint.  Following this, the patient had a tourniquet  inflated to 250 mmHg.  A 1 cm incision was made over the transverse carpal  ligament.  The dissection was  carried down through the skin with the knife  blade.  The palmar fascia was incised.  Distally, the transverse carpal  ligament was identified and released under 4.0 loupe magnification.  The fat  pad egressed nicely.  The superficial palmar arch was identified and  protected.  Following this, distal to proximal dissection was carried out  until adequate room was available for devices 1, 2, and 3, which were placed  directly under proximal leads releasing the transverse carpal ligament  without difficulty.  Following this, the security clip was placed, the  obturator disengaged.  This was placed directly midline.  Following this,  the security knife was placed and the security clip was released.  The  patient tolerated the procedure well and there were no complicating  features.  I then checked the canal, she was fully released, all looked  quite well.  The median nerve was flattened and hyperemic but certainly  intact.  I  then irrigated the wound, deflated the tourniquet, obtained  hemostasis, and closed the wound with interrupted Prolene.  A sterile  dressing was applied.  She had excellent refill, no significant bleeding,  and all looked quite well.   Attention was turned to the left upper extremity where she underwent a de  Quervain's and first CMC injection combination.  I performed this without  difficulty, entering the sheath without problems.  She tolerated the  procedure well without complicating features.  Following this, a Band-Aid  was placed in this region.  She was then transferred to the recovery room  after extubation.  She will be monitored.  Postoperative measures according  to Dr. Gerrit Friends and Dr. Noel Gerold and myself will be adhered to. All questions  have been encouraged and answered.  She tolerated the procedure well.                                               Dionne Ano. Everlene Other, M.D.    Nash Mantis  D:  06/01/2003  T:  06/02/2003  Job:  [id number]

## 2010-08-09 NOTE — Op Note (Signed)
[name], [name]                        ACCOUNT NO.:  [id number]   MEDICAL RECORD NO.:  [id number]                   PATIENT TYPE:  INP   LOCATION:  5018                                 FACILITY:  [facility]   PHYSICIAN:  Velora Heckler, M.D.                DATE OF BIRTH:  November 21, 1940   DATE OF PROCEDURE:  06/01/2003  DATE OF DISCHARGE:                                 OPERATIVE REPORT   PREOPERATIVE DIAGNOSIS:  Thyroid nodules.   POSTOPERATIVE DIAGNOSIS:  Thyroid nodules.   PROCEDURE:  Completion thyroidectomy.   SURGEON:  Velora Heckler, M.D.   ASSISTANT:  Ollen Gross. Carolynne Edouard, M.D.   ANESTHESIA:  General.   ESTIMATED BLOOD LOSS:  Minimal.   PREPARATION:  Betadine.   COMPLICATIONS:  None.   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 Richardean Chimera with left-sided thyroid  nodule.  Ultrasound demonstrated a 3.4 cm solid mass involving the left  thyroid lobe.  There were other solid nodules measuring 1.5 cm and 1.2 cm in  both the left and right thyroid lobes.  The patient had undergone a previous  right thyroid lobectomy in [place] in 1978.  However, by ultrasound and  CT, there is considerable residual right thyroid tissue present.  The  patient now comes to surgery for completion thyroidectomy concurrent with  cervical spine fusion by Dr. Sharolyn Douglas.   BODY OF REPORT:  Procedures in OR #15 through [place].  The patient was brought to the operating room and placed in a  supine position on the operating room table.  Following administration of  general anesthesia, the patient was prepped and draped in the usual strict  aseptic fashion.  After ascertaining that an adequate level of anesthesia  had been obtained, a Kocher incision was made with a #15 blade.  This was  made through the previous scar.  Dissection was carried down through skin,  subcutaneous tissue and platysma.   Hemostasis was obtained with the  electrocautery.  Skin flaps were developed cephalad and caudad from the  thyroid notch to the sternal notch.  A Mahorner self-retaining retractor was  placed for exposure.  Strap muscles were incised in the midline and  dissection was carried down to the thyroid isthmus.  Strap muscles were  reflected to the left initially.  The left thyroid lobe was carefully  dissected out.  Middle thyroid vein was divided between small Ligaclips.  The gland was mobilized.  Superior pole vessels were ligated in continuity  with 2-0 silk ties and medium Ligaclips and divided.  Superior parathyroid  gland was identified and preserved.  Inferior venous tributaries were  divided between small Ligaclips.  Branches of the inferior thyroid artery  were divided between small Ligaclips.  The gland was rolled anteriorly.  Recurrent laryngeal nerve was identified and  preserved.  The ligament of  Allyson Sabal was transected with the electrocautery and the gland was rolled up and  onto the anterior surface of the trachea.  Dry pack was placed in the left  neck.   Next, we turned our attention to the right thyroid lobe.  Again, strap  muscles were reflected laterally.  There is moderate to heavy scarring  consistent with patient's previous surgery.  However, there is a palpable 1  cm nodular density in the mid right thyroid lobe.  Approximately 60% of the  thyroid lobe is still present.  After dissecting through scar tissue,  superiorly pole vessels were ligated in continuity with 2-0 silk ties and  medium Ligaclips and divided.  Branches of the inferior thyroid artery were  divided with small Ligaclips.  The gland was rolled anteriorly.  Care was  taken to preserve parathyroid tissue and a recurrent laryngeal nerve.  Ligament of Allyson Sabal was transected with the electrocautery and the gland was  excised off the anterior trachea completely.  Suture was used to mark the  left thyroid lobe  superior pole.  The gland was submitted to pathology for  review.  Good hemostasis was obtained in the neck with small Ligaclips.  The  neck was irrigated with warm saline, which was evacuated.  Surgicel was  placed over the area of the recurrent laryngeal nerves.  Dr. Sharolyn Douglas came  to the operating room at this point and observed the operative field.  The  strap muscles were reapproximated in the midline with interrupted 3-0 Vicryl  sutures.  Good hemostasis was noted.   At this point Dr. Sharolyn Douglas and his assistant assumed control of the  procedure.  They will proceed with anterior fusion of the cervical spine.  That will be dictated under a separate operative report.  The patient  tolerated this portion of the procedure quite well.                                               Velora Heckler, M.D.    TMG/MEDQ  D:  06/01/2003  T:  06/02/2003  Job:  [id number]   cc:   Sharolyn Douglas, M.D.  [address]  [place]  [place] [address]  Fax: [phone]   [facility]   Juluis Mire, M.D.  [address] [place]  [place] [address]  Fax: [phone]   Donia Guiles, M.D.  [address] [place]  [place] [address]  Fax: [phone]

## 2011-12-24 DIAGNOSIS — E669 Obesity, unspecified: Secondary | ICD-10-CM

## 2012-07-19 ENCOUNTER — Other Ambulatory Visit: Payer: Self-pay | Admitting: Obstetrics and Gynecology

## 2012-07-19 DIAGNOSIS — R928 Other abnormal and inconclusive findings on diagnostic imaging of breast: Secondary | ICD-10-CM

## 2012-07-29 ENCOUNTER — Ambulatory Visit
Admission: RE | Admit: 2012-07-29 | Discharge: 2012-07-29 | Disposition: A | Discharge status: Home / Self Care | Payer: Medicare Other | Source: Ambulatory Visit | Attending: Obstetrics and Gynecology | Admitting: Obstetrics and Gynecology

## 2012-07-29 DIAGNOSIS — R928 Other abnormal and inconclusive findings on diagnostic imaging of breast: Secondary | ICD-10-CM

## 2013-01-19 ENCOUNTER — Encounter: Payer: Medicare Other | Attending: Family Medicine | Admitting: *Deleted

## 2013-01-19 ENCOUNTER — Encounter: Payer: Self-pay | Admitting: *Deleted

## 2013-01-19 VITALS — Ht 63.0 in | Wt 179.0 lb

## 2013-01-19 DIAGNOSIS — E669 Obesity, unspecified: Secondary | ICD-10-CM

## 2013-01-19 DIAGNOSIS — Z713 Dietary counseling and surveillance: Secondary | ICD-10-CM | POA: Insufficient documentation

## 2013-01-19 NOTE — Progress Notes (Signed)
  Medical Nutrition Therapy:  Appt start time: 0830 end time:  0930.  Assessment:  Primary concerns today: [name] is here for weight management education.  [name] is interested in medially supervised weight loss like the OptiFast program.  [name]'s husband has type 2 diabetes and he attended our core class program.  [name] attended the classes with him, but is having trouble following dietary recommendations. [name] doesn't like to cook or want to cook so most of the foods [name] eats are prepackaged bars or supplements.  [name] wants to have a supplement-based diet and doesn't really want to eat real food.  [name] would rather drink a shake or eat a bar.  [name] diet is very low in protein, fruits, vegetables, dairy, and complex carbohydrates.     Preferred Learning Style:   No preference indicated   Learning Readiness:   Not ready for lifestyle change   MEDICATIONS: see list   DIETARY INTAKE:  Usual eating pattern includes mindless eating and grazing throughout the day.  [name] can't even tell me what [name] ate yesterday because [name] wasn't mindful about any of it.    24-hr recall:   Unsure.  [name] can't remember B ( AM): 90 calorie fiber one bar with black coffee.  Sometimes Kind bars or another bar.  maybe cheese toast  D ( PM): mac-n-cheese with pork BBQ Snk ( PM): chips Beverages: water  Usual physical activity: none due knee arthritis  Estimated energy needs: 1400 calories 158 g carbohydrates 105 g protein 39 g fat    Nutritional Diagnosis:  NB-1.3 Not ready for diet/lifestyle change  As related to increasing physical activity and consuming nutritious foods.  As evidenced by sole desire for liquid meal substitutes.    Intervention:  I realized that I was not going to be a good fit for [name] because we do not have physician-supervised weight loss here at [facility].  Nor do I advocate liquid diets, herbal supplements, or quick fixes.  Encouraged [name] to try water aerobics and to be more mindful  with [name] eating.  Recommended more fruits and vegetables.  [name] really wanted a shake recommendation so I suggested Boost Glucose Control because it has very low added sugars  Teaching Method Utilized:  Auditory   Barriers to learning/adherence to lifestyle change: dependence of liquid supplements and no desire to consume whole foods.   Monitoring/Evaluation:  Dietary intake, exercise,  and body weight prn.

## 2013-02-04 ENCOUNTER — Ambulatory Visit (INDEPENDENT_AMBULATORY_CARE_PROVIDER_SITE_OTHER): Payer: Medicare Other

## 2013-02-04 ENCOUNTER — Ambulatory Visit: Payer: Self-pay

## 2013-02-04 VITALS — Ht 63.0 in | Wt 178.0 lb

## 2013-02-04 DIAGNOSIS — R52 Pain, unspecified: Secondary | ICD-10-CM

## 2013-02-04 DIAGNOSIS — M722 Plantar fascial fibromatosis: Secondary | ICD-10-CM

## 2013-02-04 NOTE — Progress Notes (Signed)
  Subjective:    Patient ID: CANDELA KRUL, female    DOB: [birth year], 72 y.o.   MRN: [id number]  HPI Comments: '' LT FOOT  UNDER THE HEEL IS HURTING. ITS BEEN HURTING FOR 3 WEEKS. TREATMENT ICE, NSAID, AND EXERCISE, BUT NOTHING CHANGE.''     Review of Systems  Musculoskeletal: Positive for gait problem and joint swelling.  All other systems reviewed and are negative.       Objective:   Physical Exam Neurovascular status is intact with pedal pulses palpable DP postal for PT postop over 4 bilateral. Epicritic and proprioceptive sensations intact and symmetric bilateral. Normal plantar response and DTRs noted. Dermatological skin color pigment and hair growth are normal nails unremarkable. Orthopedic biomechanical exam reveals pain on palpation inferior calcaneal tubercle area left foot Magan plantar fascia to inferior heel area painful and tender on palpation on weightbearing first up in the morning getting out,. Rest x-rays confirm inferior calcaneal spurring minimal spurring noted no retrocalcaneal spur no fracture noted. Patient is currently taking Mobic has been walking with [place] Comfort type shoes however other shoes tend to cause pain tenderness discomfort.       Assessment & Plan:  Assessment plantar fasciitis/heel spur syndrome left foot plan at this time fascial strapping applied to the left foot recommended ice to the heel avoid going barefoot or flip-flops which is crocs at all times or a good stable shoe maintain Mobic reappointed in 2-3 weeks for followup in be candidate for orthoses in the future based on progress  Alvan Dame DPM

## 2013-02-04 NOTE — Patient Instructions (Signed)

## 2013-02-10 ENCOUNTER — Telehealth: Payer: Self-pay | Admitting: *Deleted

## 2013-02-10 NOTE — Telephone Encounter (Addendum)
Pt states saw Dr Ralene Cork 02/04/2013 and is not a lot better.  I left message and instructed pt to continue Meloxicam and ice, and start stretches 3-4 times a day and stretch prior to getting out of resting position.  I called pt and offered an appt as soon as possible to see Dr Ralene Cork.  Pt asked if she might get an injection, I told her it may be the next step.  Transferred to scheduler.

## 2013-02-11 ENCOUNTER — Ambulatory Visit (INDEPENDENT_AMBULATORY_CARE_PROVIDER_SITE_OTHER): Payer: Medicare Other

## 2013-02-11 VITALS — BP 158/98 | HR 70 | Resp 18

## 2013-02-11 DIAGNOSIS — R52 Pain, unspecified: Secondary | ICD-10-CM

## 2013-02-11 DIAGNOSIS — M722 Plantar fascial fibromatosis: Secondary | ICD-10-CM

## 2013-02-11 MED ORDER — TRIAMCINOLONE ACETONIDE 10 MG/ML IJ SUSP: 10.0000 mg | Freq: Once | INTRAMUSCULAR | Status: DC

## 2013-02-11 NOTE — Patient Instructions (Signed)
WEARING INSTRUCTIONS FOR ORTHOTICS  Don't expect to be comfortable wearing your orthotic devices for the first time.  Like eyeglasses, you may be aware of them as time passes, they will not be uncomfortable and you will enjoy wearing them.  FOLLOW THESE INSTRUCTIONS EXACTLY!  1. Wear your orthotic devices for:       Not more than 1 hour the first day.       Not more than 2 hours the second day.       Not more than 3 hours the third day and so on.        Or wear them for as long as they feel comfortable.       If you experience discomfort in your feet or legs take them out.  When feet & legs feel       better, put them back in.  You do need to be consistent and wear them a little        everyday. 2.   If at any time the orthotic devices become acutely uncomfortable before the       time for that particular day, STOP WEARING THEM. 3.   On the next day, do not increase the wearing time. 4.   Subsequently, increase the wearing time by 15-30 minutes only if comfortable to do       so. 5.   You will be seen by your doctor about 2-4 weeks after you receive your orthotic       devices, at which time you will probably be wearing your devices comfortably        for about 8 hours or more a day. 6.   Some patients occasionally report mild aches or discomfort in other parts of the of       body such as the knees, hips or back after 3 or 4 consecutive hours of wear.  If this       is the case with you, do not extend your wearing time.  Instead, cut it back an hour or       two.  In all likelihood, these symptoms will disappear in a short period of time as your       body posture realigns itself and functions more efficiently. 7.   It is possible that your orthotic device may require some small changes or adjustment       to improve their function or make them more comfortable.   This is usually not done       before one to three months have elapsed.  These adjustments are made in        accordance  with the changed position your feet are assuming as a result of       improved biomechanical function. 8.   In women's shoes, it's not unusual for your heel to slip out of the shoe, particularly if       they are step-in-shoes.  If this is the case, try other shoes or other styles.  Try to       purchase shoes which have deeper heal seats or higher heel counters. 9.   Squeaking of orthotics devices in the shoes is due to the movement of the devices       when they are functioning normally.  To eliminate squeaking, simply dust some       baby powder into your shoes before inserting the devices.  If this does not work,          apply soap or wax to the edges of the orthotic devices or put a tissue into the shoes. 10. It is important that you follow these directions explicitly.  Failure to do so will simply       prolong the adjustment period or create problems which are easily avoided.  It makes       no difference if you are wearing your orthotic devices for only a few hours after        several months, so long as you are wearing them comfortably for those hours. 11. If you have any questions or complaints, contact our office.  We have no way of       knowing about your problems unless you tell us.  If we do not hear from you, we will       assume that you are proceeding well.     ICE INSTRUCTIONS  Apply ice or cold pack to the affected area at least 3 times a day for 10-15 minutes each time.  You should also use ice after prolonged activity or vigorous exercise.  Do not apply ice longer than 20 minutes at one time.  Always keep a cloth between your skin and the ice pack to prevent burns.  Being consistent and following these instructions will help control your symptoms.  We suggest you purchase a gel ice pack because they are reusable and do bit leak.  Some of them are designed to wrap around the area.  Use the method that works best for you.  Here are some other suggestions for icing.   Use a  frozen bag of peas or corn-inexpensive and molds well to your body, usually stays frozen for 10 to 20 minutes.  Wet a towel with cold water and squeeze out the excess until it's damp.  Place in a bag in the freezer for 20 minutes. Then remove and use. 

## 2013-02-11 NOTE — Progress Notes (Signed)
  Subjective:    Patient ID: [name], female    DOB: 1940-09-10, 72 y.o.   MRN: [id number] My left heel is still hurting and when i sit or rest and worked 4 hours and it has flared up again and would like an injection HPI    Review of Systems no new changes or findings     Objective:   Physical Exam Neurovascular status is intact. Pedal pulses palpable bilateral. Epicritic and proprioceptive sensations intact and unchanged bilateral. Patient and these have pain inferior calcaneal tubercle area left foot is exacerbated by increased work shift that she did. Should note that patient significant improvement with a fascial strapping was in place. Based on this fact patient be a candidate for functional orthoses at this time. Patient is also take this time for steroid injection to help calm down the flareup of plantar fasciitis inflammatory infracalcaneal bursitis.       Assessment & Plan:  Assessment this time plantar fasciitis/heel spur syndrome possible bursitis inferior calcaneal left. Plan at this time injection 10 mg Kenalog in 20 mg Xylocaine plain infiltrated to the inferior left heel. Following the injection orthotic scan is carried out for new functional orthoses with extrinsic posting Spenco top every patient recalled orthotics ready for fitting and dispensing within the next 3-4 weeks. In the interim fascial strapping is also applied following scanning today maintain a strapping for additional 5 days. Also continued use ice to the inferior heel area as instructed. 18 at this table supportive shoe in the interim as well.  Alvan Dame DPM

## 2013-03-02 ENCOUNTER — Ambulatory Visit (INDEPENDENT_AMBULATORY_CARE_PROVIDER_SITE_OTHER): Payer: Medicare Other

## 2013-03-02 VITALS — BP 126/73 | HR 69 | Resp 16 | Ht 62.5 in | Wt 174.0 lb

## 2013-03-02 DIAGNOSIS — M722 Plantar fascial fibromatosis: Secondary | ICD-10-CM

## 2013-03-02 DIAGNOSIS — R52 Pain, unspecified: Secondary | ICD-10-CM

## 2013-03-02 MED ORDER — DICLOFENAC SODIUM 75 MG PO TBEC: 75.0000 mg | DELAYED_RELEASE_TABLET | Freq: Two times a day (BID) | ORAL | Status: DC

## 2013-03-02 NOTE — Progress Notes (Addendum)
   Subjective:    Patient ID: [name], female    DOB: 12/04/1940, 72 y.o.   MRN: [id number] "It's not as good as I expected.  My foot actually hurt worse after the cortisone injection.  I was measured for orthotics, are they in?"  HPI    Review of Systems deferred at this time     Objective:   Physical Exam Neurovascular status is intact pedal pulses palpable he see a pain plantar fascial band left foot mid arch. A Xylocaine injection provided some relief after the third fourth initially didn't feel any better. Patient is still wearing orthotics to rescan for 2-3 weeks ago. 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 Also instructed in ice application every evening and as an adjunct to the treatment patient is placed in a night splint dispensed a night splint with break in use in wearing instructions       Assessment & Plan:  Assessment persistent plantar fasciitis/heel spur syndrome did respond strapping and orthotic scan was carried out for steroid injection provided little relief at this time we'll try an alternative NSAID therapy in the addition of a night splint which is dispensed at this time. Recheck in the next one to 3 weeks when orthotics are ready for fitting debris followup thereafter as needed  Alvan Dame DPM  Addendum to note: Prior to leaving the office my staff identified at patient's orthotics had factor I but the office patient was dispensed the prescription orthoses with break in in wearing instructions written instructions are given and oral instructions given to patient and use of the orthoses which she can start using with her casual shoes and walking shoes as instructed. Patient loss use a night splint in the evenings. At this time we'll suggest a followup in the next one to 2 months for orthotic adjustments if needed patient will call us if she has any further difficulties or  problems or fails to improve  Alvan Dame DPM

## 2013-03-02 NOTE — Patient Instructions (Addendum)
ICE INSTRUCTIONS  Apply ice or cold pack to the affected area at least 3 times a day for 10-15 minutes each time.  You should also use ice after prolonged activity or vigorous exercise.  Do not apply ice longer than 20 minutes at one time.  Always keep a cloth between your skin and the ice pack to prevent burns.  Being consistent and following these instructions will help control your symptoms.  We suggest you purchase a gel ice pack because they are reusable and do bit leak.  Some of them are designed to wrap around the area.  Use the method that works best for you.  Here are some other suggestions for icing.   Use a frozen bag of peas or corn-inexpensive and molds well to your body, usually stays frozen for 10 to 20 minutes. Wet a towel with cold water and squeeze out the excess until it's damp.  Place in a bag in the freezer for 20 minutes. Then remove and use.WEARING INSTRUCTIONS FOR          ORTHOTICS  Don't expect to be comfortable wearing your orthotic devices for the first time.  Like eyeglasses, you may be aware of them as time passes, they will not be uncomfortable and you will enjoy wearing them.  FOLLOW THESE INSTRUCTIONS EXACTLY!  Wear your orthotic devices for:       Not more than 1 hour the first day.       Not more than 2 hours the second day.       Not more than 3 hours the third day and so on.        Or wear them for as long as they feel comfortable.       If you experience discomfort in your feet or legs take them out.  When feet & legs feel       better, put them back in.  You do need to be consistent and wear them a little        everyday. 2.   If at any time the orthotic devices become acutely uncomfortable before the       time for that particular day, STOP WEARING THEM. 3.   On the next day, do not increase the wearing time. 4.   Subsequently, increase the wearing time by 15-30 minutes only if comfortable to do       so. 5.   You will be seen by your doctor  about 2-4 weeks after you receive your orthotic       devices, at which time you will probably be wearing your devices comfortably        for about 8 hours or more a day. 6.   Some patients occasionally report mild aches or discomfort in other parts of the of       body such as the knees, hips or back after 3 or 4 consecutive hours of wear.  If this       is the case with you, do not extend your wearing time.  Instead, cut it back an hour or       two.  In all likelihood, these symptoms will disappear in a short period of time as your       body posture realigns itself and functions more efficiently. 7.   It is possible that your orthotic device may require some small changes or adjustment       to improve their function or make them   more comfortable.   This is usually not done       before one to three months have elapsed.  These adjustments are made in        accordance with the changed position your feet are assuming as a result of       improved biomechanical function. 8.   In women's shoes, it's not unusual for your heel to slip out of the shoe, particularly if       they are step-in-shoes.  If this is the case, try other shoes or other styles.  Try to       purchase shoes which have deeper heal seats or higher heel counters. 9.   Squeaking of orthotics devices in the shoes is due to the movement of the devices       when they are functioning normally.  To eliminate squeaking, simply dust some       baby powder into your shoes before inserting the devices.  If this does not work,        apply soap or wax to the edges of the orthotic devices or put a tissue into the shoes. 10. It is important that you follow these directions explicitly.  Failure to do so will simply       prolong the adjustment period or create problems which are easily avoided.  It makes       no difference if you are wearing your orthotic devices for only a few hours after        several months, so long as you are  wearing them comfortably for those hours. 11. If you have any questions or complaints, contact our office.  We have no way of       knowing about your problems unless you tell us.  If we do not hear from you, we will       assume that you are proceeding well.   

## 2013-03-02 NOTE — Progress Notes (Signed)
Orthotics came in so we went ahead and dispensed them, instructions were given.

## 2013-03-30 ENCOUNTER — Ambulatory Visit (INDEPENDENT_AMBULATORY_CARE_PROVIDER_SITE_OTHER): Payer: Medicare Other

## 2013-03-30 VITALS — BP 170/95 | HR 62 | Resp 15 | Ht 63.0 in | Wt 174.0 lb

## 2013-03-30 DIAGNOSIS — M722 Plantar fascial fibromatosis: Secondary | ICD-10-CM

## 2013-03-30 NOTE — Progress Notes (Signed)
   Subjective:    Patient ID: Jake Michaelisatricia J Dowdy, female    DOB: 10/12/1940, 73 y.o.   MRN: [id number]  HPI "I'm happy to say it's doing fine."    Review of Systems no new changes or findings noted     Objective:   Physical Exam Neurovascular status intact pedal pulses palpable. Epicritic and proprioceptive sensations intact and symmetric. Patient has had rubor no pain since she's been using night splint and the orthotics are dispensed 3 or 3 or 4 weeks ago. The orthotics fit and contour well we'll continue with orthotics and night splints as needed occasional NSAID may be necessary for flareups patient discharged to an as-needed basis for any future followup       Assessment & Plan:  Assessment resolving plantar fasciitis/heel spur syndrome responding well to functional biomechanical support orthotics as well as my splint and NSAID therapies discharge to an as-needed basis for followup  Alvan Dameichard Hemi Chacko DPM

## 2013-03-30 NOTE — Patient Instructions (Signed)

## 2013-10-25 ENCOUNTER — Other Ambulatory Visit: Payer: Self-pay | Admitting: Family Medicine

## 2013-10-25 ENCOUNTER — Ambulatory Visit
Admission: RE | Admit: 2013-10-25 | Discharge: 2013-10-25 | Disposition: A | Discharge status: Home / Self Care | Payer: Medicare Other | Source: Ambulatory Visit | Attending: Family Medicine | Admitting: Family Medicine

## 2013-10-25 DIAGNOSIS — M79651 Pain in right thigh: Secondary | ICD-10-CM

## 2013-10-25 DIAGNOSIS — M79652 Pain in left thigh: Principal | ICD-10-CM

## 2013-10-25 DIAGNOSIS — M7918 Myalgia, other site: Secondary | ICD-10-CM

## 2014-01-06 ENCOUNTER — Other Ambulatory Visit: Payer: Self-pay

## 2014-01-09 ENCOUNTER — Inpatient Hospital Stay (HOSPITAL_COMMUNITY): Admit: 2014-01-09 | Payer: Medicare Other | Admitting: Orthopedic Surgery

## 2014-01-09 ENCOUNTER — Encounter (HOSPITAL_COMMUNITY): Payer: Self-pay

## 2014-01-09 SURGERY — ARTHROPLASTY, KNEE, TOTAL
Anesthesia: Spinal | Site: Knee | Laterality: Right

## 2014-09-11 ENCOUNTER — Other Ambulatory Visit: Payer: Self-pay | Admitting: Obstetrics and Gynecology

## 2014-09-12 LAB — CYTOLOGY - PAP

## 2014-09-18 ENCOUNTER — Other Ambulatory Visit: Payer: Self-pay

## 2015-06-15 DIAGNOSIS — M1811 Unilateral primary osteoarthritis of first carpometacarpal joint, right hand: Secondary | ICD-10-CM | POA: Diagnosis not present

## 2015-06-25 DIAGNOSIS — Z7952 Long term (current) use of systemic steroids: Secondary | ICD-10-CM | POA: Diagnosis not present

## 2015-06-25 DIAGNOSIS — M353 Polymyalgia rheumatica: Secondary | ICD-10-CM | POA: Diagnosis not present

## 2015-06-25 DIAGNOSIS — M2550 Pain in unspecified joint: Secondary | ICD-10-CM | POA: Diagnosis not present

## 2015-06-25 DIAGNOSIS — M150 Primary generalized (osteo)arthritis: Secondary | ICD-10-CM | POA: Diagnosis not present

## 2015-07-05 DIAGNOSIS — E7800 Pure hypercholesterolemia, unspecified: Secondary | ICD-10-CM | POA: Diagnosis not present

## 2015-07-05 DIAGNOSIS — M8580 Other specified disorders of bone density and structure, unspecified site: Secondary | ICD-10-CM | POA: Diagnosis not present

## 2015-07-05 DIAGNOSIS — Z0000 Encounter for general adult medical examination without abnormal findings: Secondary | ICD-10-CM | POA: Diagnosis not present

## 2015-07-05 DIAGNOSIS — E039 Hypothyroidism, unspecified: Secondary | ICD-10-CM | POA: Diagnosis not present

## 2015-07-05 DIAGNOSIS — Z6830 Body mass index (BMI) 30.0-30.9, adult: Secondary | ICD-10-CM | POA: Diagnosis not present

## 2015-07-05 DIAGNOSIS — I10 Essential (primary) hypertension: Secondary | ICD-10-CM | POA: Diagnosis not present

## 2015-07-05 DIAGNOSIS — M353 Polymyalgia rheumatica: Secondary | ICD-10-CM | POA: Diagnosis not present

## 2015-07-05 DIAGNOSIS — E669 Obesity, unspecified: Secondary | ICD-10-CM | POA: Diagnosis not present

## 2015-07-05 DIAGNOSIS — L409 Psoriasis, unspecified: Secondary | ICD-10-CM | POA: Diagnosis not present

## 2015-07-05 DIAGNOSIS — M159 Polyosteoarthritis, unspecified: Secondary | ICD-10-CM | POA: Diagnosis not present

## 2015-07-08 DIAGNOSIS — L03115 Cellulitis of right lower limb: Secondary | ICD-10-CM | POA: Diagnosis not present

## 2015-08-03 DIAGNOSIS — R350 Frequency of micturition: Secondary | ICD-10-CM | POA: Diagnosis not present

## 2015-08-28 DIAGNOSIS — M5416 Radiculopathy, lumbar region: Secondary | ICD-10-CM | POA: Diagnosis not present

## 2015-08-28 DIAGNOSIS — Q762 Congenital spondylolisthesis: Secondary | ICD-10-CM | POA: Diagnosis not present

## 2015-08-28 DIAGNOSIS — Z6829 Body mass index (BMI) 29.0-29.9, adult: Secondary | ICD-10-CM | POA: Diagnosis not present

## 2015-09-12 DIAGNOSIS — Z01419 Encounter for gynecological examination (general) (routine) without abnormal findings: Secondary | ICD-10-CM | POA: Diagnosis not present

## 2015-09-12 DIAGNOSIS — Z6831 Body mass index (BMI) 31.0-31.9, adult: Secondary | ICD-10-CM | POA: Diagnosis not present

## 2015-09-12 DIAGNOSIS — Z1231 Encounter for screening mammogram for malignant neoplasm of breast: Secondary | ICD-10-CM | POA: Diagnosis not present

## 2015-09-14 DIAGNOSIS — M1811 Unilateral primary osteoarthritis of first carpometacarpal joint, right hand: Secondary | ICD-10-CM | POA: Diagnosis not present

## 2015-09-14 DIAGNOSIS — M1812 Unilateral primary osteoarthritis of first carpometacarpal joint, left hand: Secondary | ICD-10-CM | POA: Diagnosis not present

## 2015-09-26 DIAGNOSIS — Z7952 Long term (current) use of systemic steroids: Secondary | ICD-10-CM | POA: Diagnosis not present

## 2015-09-26 DIAGNOSIS — M5432 Sciatica, left side: Secondary | ICD-10-CM | POA: Diagnosis not present

## 2015-09-26 DIAGNOSIS — M150 Primary generalized (osteo)arthritis: Secondary | ICD-10-CM | POA: Diagnosis not present

## 2015-09-26 DIAGNOSIS — M2550 Pain in unspecified joint: Secondary | ICD-10-CM | POA: Diagnosis not present

## 2015-09-26 DIAGNOSIS — M353 Polymyalgia rheumatica: Secondary | ICD-10-CM | POA: Diagnosis not present

## 2015-10-22 DIAGNOSIS — R350 Frequency of micturition: Secondary | ICD-10-CM | POA: Diagnosis not present

## 2015-10-26 DIAGNOSIS — M1711 Unilateral primary osteoarthritis, right knee: Secondary | ICD-10-CM | POA: Diagnosis not present

## 2015-11-12 DIAGNOSIS — L821 Other seborrheic keratosis: Secondary | ICD-10-CM | POA: Diagnosis not present

## 2015-11-12 DIAGNOSIS — K1370 Unspecified lesions of oral mucosa: Secondary | ICD-10-CM | POA: Diagnosis not present

## 2015-11-29 DIAGNOSIS — M25561 Pain in right knee: Secondary | ICD-10-CM | POA: Diagnosis not present

## 2015-11-29 DIAGNOSIS — M1711 Unilateral primary osteoarthritis, right knee: Secondary | ICD-10-CM | POA: Diagnosis not present

## 2015-12-04 DIAGNOSIS — M791 Myalgia: Secondary | ICD-10-CM | POA: Diagnosis not present

## 2015-12-04 DIAGNOSIS — Q762 Congenital spondylolisthesis: Secondary | ICD-10-CM | POA: Diagnosis not present

## 2015-12-07 DIAGNOSIS — M25561 Pain in right knee: Secondary | ICD-10-CM | POA: Diagnosis not present

## 2015-12-07 DIAGNOSIS — M1711 Unilateral primary osteoarthritis, right knee: Secondary | ICD-10-CM | POA: Diagnosis not present

## 2015-12-27 DIAGNOSIS — M353 Polymyalgia rheumatica: Secondary | ICD-10-CM | POA: Diagnosis not present

## 2015-12-27 DIAGNOSIS — M2550 Pain in unspecified joint: Secondary | ICD-10-CM | POA: Diagnosis not present

## 2015-12-27 DIAGNOSIS — Z7952 Long term (current) use of systemic steroids: Secondary | ICD-10-CM | POA: Diagnosis not present

## 2015-12-27 DIAGNOSIS — M150 Primary generalized (osteo)arthritis: Secondary | ICD-10-CM | POA: Diagnosis not present

## 2015-12-28 DIAGNOSIS — M791 Myalgia: Secondary | ICD-10-CM | POA: Diagnosis not present

## 2015-12-28 DIAGNOSIS — M47812 Spondylosis without myelopathy or radiculopathy, cervical region: Secondary | ICD-10-CM | POA: Diagnosis not present

## 2015-12-28 DIAGNOSIS — M542 Cervicalgia: Secondary | ICD-10-CM | POA: Diagnosis not present

## 2016-01-11 DIAGNOSIS — M25561 Pain in right knee: Secondary | ICD-10-CM | POA: Diagnosis not present

## 2016-01-11 DIAGNOSIS — M1711 Unilateral primary osteoarthritis, right knee: Secondary | ICD-10-CM | POA: Diagnosis not present

## 2016-01-17 DIAGNOSIS — M25561 Pain in right knee: Secondary | ICD-10-CM | POA: Diagnosis not present

## 2016-01-25 DIAGNOSIS — M1711 Unilateral primary osteoarthritis, right knee: Secondary | ICD-10-CM | POA: Diagnosis not present

## 2016-01-28 DIAGNOSIS — I10 Essential (primary) hypertension: Secondary | ICD-10-CM | POA: Diagnosis not present

## 2016-01-28 DIAGNOSIS — E669 Obesity, unspecified: Secondary | ICD-10-CM | POA: Diagnosis not present

## 2016-01-28 DIAGNOSIS — M353 Polymyalgia rheumatica: Secondary | ICD-10-CM | POA: Diagnosis not present

## 2016-01-28 DIAGNOSIS — E039 Hypothyroidism, unspecified: Secondary | ICD-10-CM | POA: Diagnosis not present

## 2016-01-28 DIAGNOSIS — E7800 Pure hypercholesterolemia, unspecified: Secondary | ICD-10-CM | POA: Diagnosis not present

## 2016-03-07 DIAGNOSIS — M1711 Unilateral primary osteoarthritis, right knee: Secondary | ICD-10-CM | POA: Diagnosis not present

## 2016-03-07 DIAGNOSIS — G8929 Other chronic pain: Secondary | ICD-10-CM | POA: Diagnosis not present

## 2016-03-07 DIAGNOSIS — M25561 Pain in right knee: Secondary | ICD-10-CM | POA: Diagnosis not present

## 2016-03-31 DIAGNOSIS — M150 Primary generalized (osteo)arthritis: Secondary | ICD-10-CM | POA: Diagnosis not present

## 2016-03-31 DIAGNOSIS — Z7952 Long term (current) use of systemic steroids: Secondary | ICD-10-CM | POA: Diagnosis not present

## 2016-03-31 DIAGNOSIS — M2550 Pain in unspecified joint: Secondary | ICD-10-CM | POA: Diagnosis not present

## 2016-03-31 DIAGNOSIS — Z6831 Body mass index (BMI) 31.0-31.9, adult: Secondary | ICD-10-CM | POA: Diagnosis not present

## 2016-03-31 DIAGNOSIS — E669 Obesity, unspecified: Secondary | ICD-10-CM | POA: Diagnosis not present

## 2016-03-31 DIAGNOSIS — M353 Polymyalgia rheumatica: Secondary | ICD-10-CM | POA: Diagnosis not present

## 2016-04-18 DIAGNOSIS — Q762 Congenital spondylolisthesis: Secondary | ICD-10-CM | POA: Diagnosis not present

## 2016-04-18 DIAGNOSIS — M5416 Radiculopathy, lumbar region: Secondary | ICD-10-CM | POA: Diagnosis not present

## 2016-04-18 DIAGNOSIS — M47896 Other spondylosis, lumbar region: Secondary | ICD-10-CM | POA: Diagnosis not present

## 2016-06-02 DIAGNOSIS — E669 Obesity, unspecified: Secondary | ICD-10-CM | POA: Diagnosis not present

## 2016-06-02 DIAGNOSIS — Z6831 Body mass index (BMI) 31.0-31.9, adult: Secondary | ICD-10-CM | POA: Diagnosis not present

## 2016-06-02 DIAGNOSIS — R635 Abnormal weight gain: Secondary | ICD-10-CM | POA: Diagnosis not present

## 2016-06-02 DIAGNOSIS — R948 Abnormal results of function studies of other organs and systems: Secondary | ICD-10-CM | POA: Diagnosis not present

## 2016-06-05 DIAGNOSIS — M159 Polyosteoarthritis, unspecified: Secondary | ICD-10-CM | POA: Diagnosis not present

## 2016-06-05 DIAGNOSIS — R635 Abnormal weight gain: Secondary | ICD-10-CM | POA: Diagnosis not present

## 2016-06-05 DIAGNOSIS — E7800 Pure hypercholesterolemia, unspecified: Secondary | ICD-10-CM | POA: Diagnosis not present

## 2016-06-05 DIAGNOSIS — I10 Essential (primary) hypertension: Secondary | ICD-10-CM | POA: Diagnosis not present

## 2016-06-13 DIAGNOSIS — Z713 Dietary counseling and surveillance: Secondary | ICD-10-CM | POA: Diagnosis not present

## 2016-06-13 DIAGNOSIS — Z6830 Body mass index (BMI) 30.0-30.9, adult: Secondary | ICD-10-CM | POA: Diagnosis not present

## 2016-06-13 DIAGNOSIS — E669 Obesity, unspecified: Secondary | ICD-10-CM | POA: Diagnosis not present

## 2016-06-18 DIAGNOSIS — Z6830 Body mass index (BMI) 30.0-30.9, adult: Secondary | ICD-10-CM | POA: Diagnosis not present

## 2016-06-18 DIAGNOSIS — E669 Obesity, unspecified: Secondary | ICD-10-CM | POA: Diagnosis not present

## 2016-06-18 DIAGNOSIS — E785 Hyperlipidemia, unspecified: Secondary | ICD-10-CM | POA: Diagnosis not present

## 2016-06-30 DIAGNOSIS — M2550 Pain in unspecified joint: Secondary | ICD-10-CM | POA: Diagnosis not present

## 2016-06-30 DIAGNOSIS — M353 Polymyalgia rheumatica: Secondary | ICD-10-CM | POA: Diagnosis not present

## 2016-06-30 DIAGNOSIS — E663 Overweight: Secondary | ICD-10-CM | POA: Diagnosis not present

## 2016-06-30 DIAGNOSIS — Z6829 Body mass index (BMI) 29.0-29.9, adult: Secondary | ICD-10-CM | POA: Diagnosis not present

## 2016-07-17 DIAGNOSIS — Z6829 Body mass index (BMI) 29.0-29.9, adult: Secondary | ICD-10-CM | POA: Diagnosis not present

## 2016-07-17 DIAGNOSIS — E663 Overweight: Secondary | ICD-10-CM | POA: Diagnosis not present

## 2016-07-17 DIAGNOSIS — E785 Hyperlipidemia, unspecified: Secondary | ICD-10-CM | POA: Diagnosis not present

## 2016-07-17 DIAGNOSIS — R635 Abnormal weight gain: Secondary | ICD-10-CM | POA: Diagnosis not present

## 2016-07-23 DIAGNOSIS — Z713 Dietary counseling and surveillance: Secondary | ICD-10-CM | POA: Diagnosis not present

## 2016-07-23 DIAGNOSIS — E669 Obesity, unspecified: Secondary | ICD-10-CM | POA: Diagnosis not present

## 2016-07-23 DIAGNOSIS — Z6830 Body mass index (BMI) 30.0-30.9, adult: Secondary | ICD-10-CM | POA: Diagnosis not present

## 2016-08-26 DIAGNOSIS — M353 Polymyalgia rheumatica: Secondary | ICD-10-CM | POA: Diagnosis not present

## 2016-08-26 DIAGNOSIS — E039 Hypothyroidism, unspecified: Secondary | ICD-10-CM | POA: Diagnosis not present

## 2016-08-26 DIAGNOSIS — Z0000 Encounter for general adult medical examination without abnormal findings: Secondary | ICD-10-CM | POA: Diagnosis not present

## 2016-08-26 DIAGNOSIS — I10 Essential (primary) hypertension: Secondary | ICD-10-CM | POA: Diagnosis not present

## 2016-09-01 DIAGNOSIS — M353 Polymyalgia rheumatica: Secondary | ICD-10-CM | POA: Diagnosis not present

## 2016-09-01 DIAGNOSIS — Z6828 Body mass index (BMI) 28.0-28.9, adult: Secondary | ICD-10-CM | POA: Diagnosis not present

## 2016-09-01 DIAGNOSIS — E663 Overweight: Secondary | ICD-10-CM | POA: Diagnosis not present

## 2016-09-01 DIAGNOSIS — M2550 Pain in unspecified joint: Secondary | ICD-10-CM | POA: Diagnosis not present

## 2016-09-03 DIAGNOSIS — Z713 Dietary counseling and surveillance: Secondary | ICD-10-CM | POA: Diagnosis not present

## 2016-09-15 DIAGNOSIS — Z1231 Encounter for screening mammogram for malignant neoplasm of breast: Secondary | ICD-10-CM | POA: Diagnosis not present

## 2016-09-15 DIAGNOSIS — Z01419 Encounter for gynecological examination (general) (routine) without abnormal findings: Secondary | ICD-10-CM | POA: Diagnosis not present

## 2016-09-15 DIAGNOSIS — Z6823 Body mass index (BMI) 23.0-23.9, adult: Secondary | ICD-10-CM | POA: Diagnosis not present

## 2016-09-16 DIAGNOSIS — E663 Overweight: Secondary | ICD-10-CM | POA: Diagnosis not present

## 2016-09-16 DIAGNOSIS — Z6828 Body mass index (BMI) 28.0-28.9, adult: Secondary | ICD-10-CM | POA: Diagnosis not present

## 2016-09-16 DIAGNOSIS — E785 Hyperlipidemia, unspecified: Secondary | ICD-10-CM | POA: Diagnosis not present

## 2016-09-16 DIAGNOSIS — M159 Polyosteoarthritis, unspecified: Secondary | ICD-10-CM | POA: Diagnosis not present

## 2016-10-06 DIAGNOSIS — M25561 Pain in right knee: Secondary | ICD-10-CM | POA: Diagnosis not present

## 2016-10-06 DIAGNOSIS — G8929 Other chronic pain: Secondary | ICD-10-CM | POA: Diagnosis not present

## 2016-10-06 DIAGNOSIS — M1711 Unilateral primary osteoarthritis, right knee: Secondary | ICD-10-CM | POA: Diagnosis not present

## 2016-10-09 DIAGNOSIS — Z713 Dietary counseling and surveillance: Secondary | ICD-10-CM | POA: Diagnosis not present

## 2016-10-09 DIAGNOSIS — E663 Overweight: Secondary | ICD-10-CM | POA: Diagnosis not present

## 2016-10-09 DIAGNOSIS — Z6828 Body mass index (BMI) 28.0-28.9, adult: Secondary | ICD-10-CM | POA: Diagnosis not present

## 2016-11-07 DIAGNOSIS — M1711 Unilateral primary osteoarthritis, right knee: Secondary | ICD-10-CM | POA: Diagnosis not present

## 2016-11-14 DIAGNOSIS — M1711 Unilateral primary osteoarthritis, right knee: Secondary | ICD-10-CM | POA: Diagnosis not present

## 2016-11-14 DIAGNOSIS — G8929 Other chronic pain: Secondary | ICD-10-CM | POA: Diagnosis not present

## 2016-11-14 DIAGNOSIS — M25561 Pain in right knee: Secondary | ICD-10-CM | POA: Diagnosis not present

## 2016-11-20 DIAGNOSIS — Z6827 Body mass index (BMI) 27.0-27.9, adult: Secondary | ICD-10-CM | POA: Diagnosis not present

## 2016-11-20 DIAGNOSIS — E663 Overweight: Secondary | ICD-10-CM | POA: Diagnosis not present

## 2016-11-21 DIAGNOSIS — M1711 Unilateral primary osteoarthritis, right knee: Secondary | ICD-10-CM | POA: Diagnosis not present

## 2016-12-03 DIAGNOSIS — Z6826 Body mass index (BMI) 26.0-26.9, adult: Secondary | ICD-10-CM | POA: Diagnosis not present

## 2016-12-03 DIAGNOSIS — M2550 Pain in unspecified joint: Secondary | ICD-10-CM | POA: Diagnosis not present

## 2016-12-03 DIAGNOSIS — M353 Polymyalgia rheumatica: Secondary | ICD-10-CM | POA: Diagnosis not present

## 2016-12-03 DIAGNOSIS — E663 Overweight: Secondary | ICD-10-CM | POA: Diagnosis not present

## 2016-12-19 DIAGNOSIS — H25813 Combined forms of age-related cataract, bilateral: Secondary | ICD-10-CM | POA: Diagnosis not present

## 2016-12-19 DIAGNOSIS — H43813 Vitreous degeneration, bilateral: Secondary | ICD-10-CM | POA: Diagnosis not present

## 2016-12-19 DIAGNOSIS — D3132 Benign neoplasm of left choroid: Secondary | ICD-10-CM | POA: Diagnosis not present

## 2016-12-22 DIAGNOSIS — E663 Overweight: Secondary | ICD-10-CM | POA: Diagnosis not present

## 2016-12-22 DIAGNOSIS — Z6826 Body mass index (BMI) 26.0-26.9, adult: Secondary | ICD-10-CM | POA: Diagnosis not present

## 2016-12-22 DIAGNOSIS — R635 Abnormal weight gain: Secondary | ICD-10-CM | POA: Diagnosis not present

## 2016-12-24 DIAGNOSIS — E663 Overweight: Secondary | ICD-10-CM | POA: Diagnosis not present

## 2016-12-24 DIAGNOSIS — Z713 Dietary counseling and surveillance: Secondary | ICD-10-CM | POA: Diagnosis not present

## 2016-12-24 DIAGNOSIS — Z6826 Body mass index (BMI) 26.0-26.9, adult: Secondary | ICD-10-CM | POA: Diagnosis not present

## 2016-12-31 DIAGNOSIS — Z6826 Body mass index (BMI) 26.0-26.9, adult: Secondary | ICD-10-CM | POA: Diagnosis not present

## 2016-12-31 DIAGNOSIS — E785 Hyperlipidemia, unspecified: Secondary | ICD-10-CM | POA: Diagnosis not present

## 2016-12-31 DIAGNOSIS — E663 Overweight: Secondary | ICD-10-CM | POA: Diagnosis not present

## 2016-12-31 DIAGNOSIS — Z713 Dietary counseling and surveillance: Secondary | ICD-10-CM | POA: Diagnosis not present

## 2017-01-20 DIAGNOSIS — Z23 Encounter for immunization: Secondary | ICD-10-CM | POA: Diagnosis not present

## 2017-03-11 DIAGNOSIS — M353 Polymyalgia rheumatica: Secondary | ICD-10-CM | POA: Diagnosis not present

## 2017-03-11 DIAGNOSIS — M2550 Pain in unspecified joint: Secondary | ICD-10-CM | POA: Diagnosis not present

## 2017-03-11 DIAGNOSIS — E663 Overweight: Secondary | ICD-10-CM | POA: Diagnosis not present

## 2017-03-11 DIAGNOSIS — Z6826 Body mass index (BMI) 26.0-26.9, adult: Secondary | ICD-10-CM | POA: Diagnosis not present

## 2017-03-31 DIAGNOSIS — I10 Essential (primary) hypertension: Secondary | ICD-10-CM | POA: Diagnosis not present

## 2017-03-31 DIAGNOSIS — E663 Overweight: Secondary | ICD-10-CM | POA: Diagnosis not present

## 2017-03-31 DIAGNOSIS — Z6825 Body mass index (BMI) 25.0-25.9, adult: Secondary | ICD-10-CM | POA: Diagnosis not present

## 2017-03-31 DIAGNOSIS — Z713 Dietary counseling and surveillance: Secondary | ICD-10-CM | POA: Diagnosis not present

## 2017-06-10 DIAGNOSIS — M2550 Pain in unspecified joint: Secondary | ICD-10-CM | POA: Diagnosis not present

## 2017-06-10 DIAGNOSIS — M353 Polymyalgia rheumatica: Secondary | ICD-10-CM | POA: Diagnosis not present

## 2017-06-10 DIAGNOSIS — M150 Primary generalized (osteo)arthritis: Secondary | ICD-10-CM | POA: Diagnosis not present

## 2017-06-10 DIAGNOSIS — Z7952 Long term (current) use of systemic steroids: Secondary | ICD-10-CM | POA: Diagnosis not present

## 2017-07-06 DIAGNOSIS — M1712 Unilateral primary osteoarthritis, left knee: Secondary | ICD-10-CM | POA: Diagnosis not present

## 2017-07-06 DIAGNOSIS — M1711 Unilateral primary osteoarthritis, right knee: Secondary | ICD-10-CM | POA: Diagnosis not present

## 2017-07-07 DIAGNOSIS — I10 Essential (primary) hypertension: Secondary | ICD-10-CM | POA: Diagnosis not present

## 2017-07-07 DIAGNOSIS — E663 Overweight: Secondary | ICD-10-CM | POA: Diagnosis not present

## 2017-07-07 DIAGNOSIS — Z6825 Body mass index (BMI) 25.0-25.9, adult: Secondary | ICD-10-CM | POA: Diagnosis not present

## 2017-07-07 DIAGNOSIS — Z713 Dietary counseling and surveillance: Secondary | ICD-10-CM | POA: Diagnosis not present

## 2017-07-13 DIAGNOSIS — M25511 Pain in right shoulder: Secondary | ICD-10-CM | POA: Diagnosis not present

## 2017-07-13 DIAGNOSIS — M1711 Unilateral primary osteoarthritis, right knee: Secondary | ICD-10-CM | POA: Diagnosis not present

## 2017-07-13 DIAGNOSIS — M25561 Pain in right knee: Secondary | ICD-10-CM | POA: Diagnosis not present

## 2017-07-17 DIAGNOSIS — M5416 Radiculopathy, lumbar region: Secondary | ICD-10-CM | POA: Diagnosis not present

## 2017-07-17 DIAGNOSIS — M542 Cervicalgia: Secondary | ICD-10-CM | POA: Diagnosis not present

## 2017-07-17 DIAGNOSIS — Q762 Congenital spondylolisthesis: Secondary | ICD-10-CM | POA: Diagnosis not present

## 2017-07-17 DIAGNOSIS — M47896 Other spondylosis, lumbar region: Secondary | ICD-10-CM | POA: Diagnosis not present

## 2017-07-21 DIAGNOSIS — R350 Frequency of micturition: Secondary | ICD-10-CM | POA: Diagnosis not present

## 2017-07-27 DIAGNOSIS — N951 Menopausal and female climacteric states: Secondary | ICD-10-CM | POA: Diagnosis not present

## 2017-07-27 DIAGNOSIS — N390 Urinary tract infection, site not specified: Secondary | ICD-10-CM | POA: Diagnosis not present

## 2017-07-27 DIAGNOSIS — N952 Postmenopausal atrophic vaginitis: Secondary | ICD-10-CM | POA: Diagnosis not present

## 2017-07-27 DIAGNOSIS — N760 Acute vaginitis: Secondary | ICD-10-CM | POA: Diagnosis not present

## 2017-08-03 DIAGNOSIS — R102 Pelvic and perineal pain: Secondary | ICD-10-CM | POA: Diagnosis not present

## 2017-08-03 DIAGNOSIS — R350 Frequency of micturition: Secondary | ICD-10-CM | POA: Diagnosis not present

## 2017-08-31 DIAGNOSIS — Z0000 Encounter for general adult medical examination without abnormal findings: Secondary | ICD-10-CM | POA: Diagnosis not present

## 2017-08-31 DIAGNOSIS — M353 Polymyalgia rheumatica: Secondary | ICD-10-CM | POA: Diagnosis not present

## 2017-08-31 DIAGNOSIS — E039 Hypothyroidism, unspecified: Secondary | ICD-10-CM | POA: Diagnosis not present

## 2017-08-31 DIAGNOSIS — R350 Frequency of micturition: Secondary | ICD-10-CM | POA: Diagnosis not present

## 2017-08-31 DIAGNOSIS — I10 Essential (primary) hypertension: Secondary | ICD-10-CM | POA: Diagnosis not present

## 2017-09-05 DIAGNOSIS — S70362A Insect bite (nonvenomous), left thigh, initial encounter: Secondary | ICD-10-CM | POA: Diagnosis not present

## 2017-09-05 DIAGNOSIS — W57XXXA Bitten or stung by nonvenomous insect and other nonvenomous arthropods, initial encounter: Secondary | ICD-10-CM | POA: Diagnosis not present

## 2017-09-07 DIAGNOSIS — M542 Cervicalgia: Secondary | ICD-10-CM | POA: Diagnosis not present

## 2017-09-07 DIAGNOSIS — M47812 Spondylosis without myelopathy or radiculopathy, cervical region: Secondary | ICD-10-CM | POA: Diagnosis not present

## 2017-09-07 DIAGNOSIS — M5106 Intervertebral disc disorders with myelopathy, lumbar region: Secondary | ICD-10-CM | POA: Diagnosis not present

## 2017-09-10 DIAGNOSIS — M2550 Pain in unspecified joint: Secondary | ICD-10-CM | POA: Diagnosis not present

## 2017-09-10 DIAGNOSIS — E663 Overweight: Secondary | ICD-10-CM | POA: Diagnosis not present

## 2017-09-10 DIAGNOSIS — M353 Polymyalgia rheumatica: Secondary | ICD-10-CM | POA: Diagnosis not present

## 2017-09-10 DIAGNOSIS — M1711 Unilateral primary osteoarthritis, right knee: Secondary | ICD-10-CM | POA: Diagnosis not present

## 2017-09-10 DIAGNOSIS — Z6825 Body mass index (BMI) 25.0-25.9, adult: Secondary | ICD-10-CM | POA: Diagnosis not present

## 2017-09-18 DIAGNOSIS — M1711 Unilateral primary osteoarthritis, right knee: Secondary | ICD-10-CM | POA: Diagnosis not present

## 2017-09-23 DIAGNOSIS — L259 Unspecified contact dermatitis, unspecified cause: Secondary | ICD-10-CM | POA: Diagnosis not present

## 2017-09-28 DIAGNOSIS — M8588 Other specified disorders of bone density and structure, other site: Secondary | ICD-10-CM | POA: Diagnosis not present

## 2017-09-28 DIAGNOSIS — M1711 Unilateral primary osteoarthritis, right knee: Secondary | ICD-10-CM | POA: Diagnosis not present

## 2017-09-28 DIAGNOSIS — Z6826 Body mass index (BMI) 26.0-26.9, adult: Secondary | ICD-10-CM | POA: Diagnosis not present

## 2017-09-28 DIAGNOSIS — Z1231 Encounter for screening mammogram for malignant neoplasm of breast: Secondary | ICD-10-CM | POA: Diagnosis not present

## 2017-09-28 DIAGNOSIS — Z01419 Encounter for gynecological examination (general) (routine) without abnormal findings: Secondary | ICD-10-CM | POA: Diagnosis not present

## 2017-10-06 DIAGNOSIS — M159 Polyosteoarthritis, unspecified: Secondary | ICD-10-CM | POA: Diagnosis not present

## 2017-10-06 DIAGNOSIS — I10 Essential (primary) hypertension: Secondary | ICD-10-CM | POA: Diagnosis not present

## 2017-10-06 DIAGNOSIS — E663 Overweight: Secondary | ICD-10-CM | POA: Diagnosis not present

## 2017-11-04 DIAGNOSIS — E039 Hypothyroidism, unspecified: Secondary | ICD-10-CM | POA: Diagnosis not present

## 2017-11-11 DIAGNOSIS — R21 Rash and other nonspecific skin eruption: Secondary | ICD-10-CM | POA: Diagnosis not present

## 2017-11-13 DIAGNOSIS — R350 Frequency of micturition: Secondary | ICD-10-CM | POA: Diagnosis not present

## 2017-11-13 DIAGNOSIS — R351 Nocturia: Secondary | ICD-10-CM | POA: Diagnosis not present

## 2017-11-25 DIAGNOSIS — Z7952 Long term (current) use of systemic steroids: Secondary | ICD-10-CM | POA: Diagnosis not present

## 2017-11-25 DIAGNOSIS — M353 Polymyalgia rheumatica: Secondary | ICD-10-CM | POA: Diagnosis not present

## 2017-11-25 DIAGNOSIS — M150 Primary generalized (osteo)arthritis: Secondary | ICD-10-CM | POA: Diagnosis not present

## 2017-11-25 DIAGNOSIS — M2550 Pain in unspecified joint: Secondary | ICD-10-CM | POA: Diagnosis not present

## 2017-12-23 DIAGNOSIS — H25813 Combined forms of age-related cataract, bilateral: Secondary | ICD-10-CM | POA: Diagnosis not present

## 2017-12-23 DIAGNOSIS — H43813 Vitreous degeneration, bilateral: Secondary | ICD-10-CM | POA: Diagnosis not present

## 2017-12-23 DIAGNOSIS — D3132 Benign neoplasm of left choroid: Secondary | ICD-10-CM | POA: Diagnosis not present

## 2017-12-26 DIAGNOSIS — R350 Frequency of micturition: Secondary | ICD-10-CM | POA: Diagnosis not present

## 2018-01-06 DIAGNOSIS — R350 Frequency of micturition: Secondary | ICD-10-CM | POA: Diagnosis not present

## 2018-01-06 DIAGNOSIS — R351 Nocturia: Secondary | ICD-10-CM | POA: Diagnosis not present

## 2018-01-06 DIAGNOSIS — R3911 Hesitancy of micturition: Secondary | ICD-10-CM | POA: Diagnosis not present

## 2018-01-08 DIAGNOSIS — Z23 Encounter for immunization: Secondary | ICD-10-CM | POA: Diagnosis not present

## 2018-01-08 DIAGNOSIS — E039 Hypothyroidism, unspecified: Secondary | ICD-10-CM | POA: Diagnosis not present

## 2018-02-24 DIAGNOSIS — M353 Polymyalgia rheumatica: Secondary | ICD-10-CM | POA: Diagnosis not present

## 2018-02-24 DIAGNOSIS — M2550 Pain in unspecified joint: Secondary | ICD-10-CM | POA: Diagnosis not present

## 2018-02-24 DIAGNOSIS — M150 Primary generalized (osteo)arthritis: Secondary | ICD-10-CM | POA: Diagnosis not present

## 2018-02-24 DIAGNOSIS — Z7952 Long term (current) use of systemic steroids: Secondary | ICD-10-CM | POA: Diagnosis not present

## 2018-03-09 DIAGNOSIS — E039 Hypothyroidism, unspecified: Secondary | ICD-10-CM | POA: Diagnosis not present

## 2018-05-17 DIAGNOSIS — M1711 Unilateral primary osteoarthritis, right knee: Secondary | ICD-10-CM | POA: Diagnosis not present

## 2018-06-02 DIAGNOSIS — M2550 Pain in unspecified joint: Secondary | ICD-10-CM | POA: Diagnosis not present

## 2018-06-02 DIAGNOSIS — M150 Primary generalized (osteo)arthritis: Secondary | ICD-10-CM | POA: Diagnosis not present

## 2018-06-02 DIAGNOSIS — M353 Polymyalgia rheumatica: Secondary | ICD-10-CM | POA: Diagnosis not present

## 2018-06-02 DIAGNOSIS — Z7952 Long term (current) use of systemic steroids: Secondary | ICD-10-CM | POA: Diagnosis not present

## 2018-06-29 DIAGNOSIS — M791 Myalgia, unspecified site: Secondary | ICD-10-CM | POA: Diagnosis not present

## 2018-06-29 DIAGNOSIS — M5416 Radiculopathy, lumbar region: Secondary | ICD-10-CM | POA: Diagnosis not present

## 2018-06-29 DIAGNOSIS — M47812 Spondylosis without myelopathy or radiculopathy, cervical region: Secondary | ICD-10-CM | POA: Diagnosis not present

## 2018-06-29 DIAGNOSIS — M4316 Spondylolisthesis, lumbar region: Secondary | ICD-10-CM | POA: Diagnosis not present

## 2018-07-14 DIAGNOSIS — R3911 Hesitancy of micturition: Secondary | ICD-10-CM | POA: Diagnosis not present

## 2018-07-14 DIAGNOSIS — R350 Frequency of micturition: Secondary | ICD-10-CM | POA: Diagnosis not present

## 2018-07-14 DIAGNOSIS — R351 Nocturia: Secondary | ICD-10-CM | POA: Diagnosis not present

## 2018-08-19 DIAGNOSIS — M2550 Pain in unspecified joint: Secondary | ICD-10-CM | POA: Diagnosis not present

## 2018-08-19 DIAGNOSIS — M154 Erosive (osteo)arthritis: Secondary | ICD-10-CM | POA: Diagnosis not present

## 2018-08-19 DIAGNOSIS — M353 Polymyalgia rheumatica: Secondary | ICD-10-CM | POA: Diagnosis not present

## 2018-08-19 DIAGNOSIS — M150 Primary generalized (osteo)arthritis: Secondary | ICD-10-CM | POA: Diagnosis not present

## 2018-09-14 DIAGNOSIS — M791 Myalgia, unspecified site: Secondary | ICD-10-CM | POA: Diagnosis not present

## 2018-09-14 DIAGNOSIS — M4316 Spondylolisthesis, lumbar region: Secondary | ICD-10-CM | POA: Diagnosis not present

## 2018-09-14 DIAGNOSIS — M5416 Radiculopathy, lumbar region: Secondary | ICD-10-CM | POA: Diagnosis not present

## 2018-09-14 DIAGNOSIS — M549 Dorsalgia, unspecified: Secondary | ICD-10-CM | POA: Diagnosis not present

## 2018-09-15 DIAGNOSIS — M25561 Pain in right knee: Secondary | ICD-10-CM | POA: Diagnosis not present

## 2018-09-15 DIAGNOSIS — M1711 Unilateral primary osteoarthritis, right knee: Secondary | ICD-10-CM | POA: Diagnosis not present

## 2018-09-17 ENCOUNTER — Other Ambulatory Visit: Payer: Self-pay | Admitting: Orthopaedic Surgery

## 2018-09-17 DIAGNOSIS — M5416 Radiculopathy, lumbar region: Secondary | ICD-10-CM

## 2018-09-23 DIAGNOSIS — M25561 Pain in right knee: Secondary | ICD-10-CM | POA: Diagnosis not present

## 2018-09-23 DIAGNOSIS — E039 Hypothyroidism, unspecified: Secondary | ICD-10-CM | POA: Diagnosis not present

## 2018-09-23 DIAGNOSIS — M1711 Unilateral primary osteoarthritis, right knee: Secondary | ICD-10-CM | POA: Diagnosis not present

## 2018-09-23 DIAGNOSIS — E7800 Pure hypercholesterolemia, unspecified: Secondary | ICD-10-CM | POA: Diagnosis not present

## 2018-09-23 DIAGNOSIS — I10 Essential (primary) hypertension: Secondary | ICD-10-CM | POA: Diagnosis not present

## 2018-09-27 DIAGNOSIS — E7800 Pure hypercholesterolemia, unspecified: Secondary | ICD-10-CM | POA: Diagnosis not present

## 2018-09-27 DIAGNOSIS — Z0000 Encounter for general adult medical examination without abnormal findings: Secondary | ICD-10-CM | POA: Diagnosis not present

## 2018-09-27 DIAGNOSIS — E039 Hypothyroidism, unspecified: Secondary | ICD-10-CM | POA: Diagnosis not present

## 2018-09-27 DIAGNOSIS — I10 Essential (primary) hypertension: Secondary | ICD-10-CM | POA: Diagnosis not present

## 2018-09-30 DIAGNOSIS — M25552 Pain in left hip: Secondary | ICD-10-CM | POA: Diagnosis not present

## 2018-09-30 DIAGNOSIS — Z6826 Body mass index (BMI) 26.0-26.9, adult: Secondary | ICD-10-CM | POA: Diagnosis not present

## 2018-09-30 DIAGNOSIS — Z01419 Encounter for gynecological examination (general) (routine) without abnormal findings: Secondary | ICD-10-CM | POA: Diagnosis not present

## 2018-09-30 DIAGNOSIS — M1711 Unilateral primary osteoarthritis, right knee: Secondary | ICD-10-CM | POA: Diagnosis not present

## 2018-09-30 DIAGNOSIS — Z1231 Encounter for screening mammogram for malignant neoplasm of breast: Secondary | ICD-10-CM | POA: Diagnosis not present

## 2018-10-12 ENCOUNTER — Ambulatory Visit
Admission: RE | Admit: 2018-10-12 | Discharge: 2018-10-12 | Disposition: A | Discharge status: Home / Self Care | Payer: Medicare Other | Source: Ambulatory Visit | Attending: Orthopaedic Surgery | Admitting: Orthopaedic Surgery

## 2018-10-12 ENCOUNTER — Other Ambulatory Visit: Payer: Self-pay

## 2018-10-12 DIAGNOSIS — M48061 Spinal stenosis, lumbar region without neurogenic claudication: Secondary | ICD-10-CM | POA: Diagnosis not present

## 2018-10-12 DIAGNOSIS — M5416 Radiculopathy, lumbar region: Secondary | ICD-10-CM

## 2018-10-18 DIAGNOSIS — M5416 Radiculopathy, lumbar region: Secondary | ICD-10-CM | POA: Diagnosis not present

## 2018-11-03 DIAGNOSIS — M4316 Spondylolisthesis, lumbar region: Secondary | ICD-10-CM | POA: Diagnosis not present

## 2018-11-03 DIAGNOSIS — M5416 Radiculopathy, lumbar region: Secondary | ICD-10-CM | POA: Diagnosis not present

## 2018-11-03 DIAGNOSIS — Z6829 Body mass index (BMI) 29.0-29.9, adult: Secondary | ICD-10-CM | POA: Diagnosis not present

## 2018-11-23 DIAGNOSIS — M5416 Radiculopathy, lumbar region: Secondary | ICD-10-CM | POA: Diagnosis not present

## 2018-12-04 DIAGNOSIS — Z23 Encounter for immunization: Secondary | ICD-10-CM | POA: Diagnosis not present

## 2018-12-06 DIAGNOSIS — M4316 Spondylolisthesis, lumbar region: Secondary | ICD-10-CM | POA: Diagnosis not present

## 2018-12-06 DIAGNOSIS — M5416 Radiculopathy, lumbar region: Secondary | ICD-10-CM | POA: Diagnosis not present

## 2018-12-06 DIAGNOSIS — I10 Essential (primary) hypertension: Secondary | ICD-10-CM | POA: Diagnosis not present

## 2018-12-06 DIAGNOSIS — Z6829 Body mass index (BMI) 29.0-29.9, adult: Secondary | ICD-10-CM | POA: Diagnosis not present

## 2018-12-16 DIAGNOSIS — Z1159 Encounter for screening for other viral diseases: Secondary | ICD-10-CM | POA: Diagnosis not present

## 2018-12-20 DIAGNOSIS — M47816 Spondylosis without myelopathy or radiculopathy, lumbar region: Secondary | ICD-10-CM | POA: Diagnosis not present

## 2018-12-21 DIAGNOSIS — Z86010 Personal history of colonic polyps: Secondary | ICD-10-CM | POA: Diagnosis not present

## 2018-12-21 DIAGNOSIS — K5730 Diverticulosis of large intestine without perforation or abscess without bleeding: Secondary | ICD-10-CM | POA: Diagnosis not present

## 2018-12-27 DIAGNOSIS — M47816 Spondylosis without myelopathy or radiculopathy, lumbar region: Secondary | ICD-10-CM | POA: Diagnosis not present

## 2019-01-04 DIAGNOSIS — M25511 Pain in right shoulder: Secondary | ICD-10-CM | POA: Diagnosis not present

## 2019-01-11 DIAGNOSIS — H43813 Vitreous degeneration, bilateral: Secondary | ICD-10-CM | POA: Diagnosis not present

## 2019-01-11 DIAGNOSIS — H25813 Combined forms of age-related cataract, bilateral: Secondary | ICD-10-CM | POA: Diagnosis not present

## 2019-01-11 DIAGNOSIS — D3132 Benign neoplasm of left choroid: Secondary | ICD-10-CM | POA: Diagnosis not present

## 2019-02-08 DIAGNOSIS — M47816 Spondylosis without myelopathy or radiculopathy, lumbar region: Secondary | ICD-10-CM | POA: Diagnosis not present

## 2019-03-08 DIAGNOSIS — M47816 Spondylosis without myelopathy or radiculopathy, lumbar region: Secondary | ICD-10-CM | POA: Diagnosis not present

## 2019-03-08 DIAGNOSIS — M4316 Spondylolisthesis, lumbar region: Secondary | ICD-10-CM | POA: Diagnosis not present

## 2019-04-13 ENCOUNTER — Ambulatory Visit: Payer: Medicare Other | Attending: Internal Medicine

## 2019-04-13 DIAGNOSIS — Z23 Encounter for immunization: Secondary | ICD-10-CM | POA: Insufficient documentation

## 2019-04-13 NOTE — Progress Notes (Signed)
   Covid-19 Vaccination Clinic  Name:  [name]    MRN: [id number] DOB: July 03, 1940  04/13/2019  [name] was observed post Covid-19 immunization for 15 minutes without incidence. She was provided with Vaccine Information Sheet and instruction to access the V-Safe system.   [name] was instructed to call 911 with any severe reactions post vaccine: Marland Kitchen Difficulty breathing  . Swelling of your face and throat  . A fast heartbeat  . A bad rash all over your body  . Dizziness and weakness    Immunizations Administered    Name Date Dose VIS Date Route   Pfizer COVID-19 Vaccine 04/13/2019  8:21 AM 0.3 mL 03/04/2019 Intramuscular   Manufacturer: [employer], [employer]   Lot: [id number]   NDC: [address]-2

## 2019-05-04 ENCOUNTER — Ambulatory Visit: Payer: Medicare Other | Attending: Internal Medicine

## 2019-05-04 DIAGNOSIS — Z23 Encounter for immunization: Secondary | ICD-10-CM | POA: Insufficient documentation

## 2019-05-04 NOTE — Progress Notes (Signed)
   Covid-19 Vaccination Clinic  Name:  [name]    MRN: [id number] DOB: 1941-03-13  05/04/2019  [name] was observed post Covid-19 immunization for 15 minutes without incidence. She was provided with Vaccine Information Sheet and instruction to access the V-Safe system.   [name] was instructed to call 911 with any severe reactions post vaccine: Marland Kitchen Difficulty breathing  . Swelling of your face and throat  . A fast heartbeat  . A bad rash all over your body  . Dizziness and weakness    Immunizations Administered    Name Date Dose VIS Date Route   Pfizer COVID-19 Vaccine 05/04/2019 10:09 AM 0.3 mL 03/04/2019 Intramuscular   Manufacturer: [employer], [employer]   Lot: [id number]   NDC: [address]-2

## 2019-05-10 DIAGNOSIS — I10 Essential (primary) hypertension: Secondary | ICD-10-CM | POA: Diagnosis not present

## 2019-05-10 DIAGNOSIS — R000 Tachycardia, unspecified: Secondary | ICD-10-CM | POA: Diagnosis not present

## 2019-06-06 DIAGNOSIS — M353 Polymyalgia rheumatica: Secondary | ICD-10-CM | POA: Diagnosis not present

## 2019-06-06 DIAGNOSIS — M5430 Sciatica, unspecified side: Secondary | ICD-10-CM | POA: Diagnosis not present

## 2019-06-06 DIAGNOSIS — M1990 Unspecified osteoarthritis, unspecified site: Secondary | ICD-10-CM | POA: Diagnosis not present

## 2019-07-06 DIAGNOSIS — E039 Hypothyroidism, unspecified: Secondary | ICD-10-CM | POA: Diagnosis not present

## 2019-07-21 DIAGNOSIS — R350 Frequency of micturition: Secondary | ICD-10-CM | POA: Diagnosis not present

## 2019-07-21 DIAGNOSIS — R351 Nocturia: Secondary | ICD-10-CM | POA: Diagnosis not present

## 2019-07-26 DIAGNOSIS — Z0120 Encounter for dental examination and cleaning without abnormal findings: Secondary | ICD-10-CM | POA: Diagnosis not present

## 2019-08-08 DIAGNOSIS — Z0120 Encounter for dental examination and cleaning without abnormal findings: Secondary | ICD-10-CM | POA: Diagnosis not present

## 2019-09-29 DIAGNOSIS — E7800 Pure hypercholesterolemia, unspecified: Secondary | ICD-10-CM | POA: Diagnosis not present

## 2019-09-29 DIAGNOSIS — Z0000 Encounter for general adult medical examination without abnormal findings: Secondary | ICD-10-CM | POA: Diagnosis not present

## 2019-09-29 DIAGNOSIS — I10 Essential (primary) hypertension: Secondary | ICD-10-CM | POA: Diagnosis not present

## 2019-09-29 DIAGNOSIS — E039 Hypothyroidism, unspecified: Secondary | ICD-10-CM | POA: Diagnosis not present

## 2019-12-19 DIAGNOSIS — Z6827 Body mass index (BMI) 27.0-27.9, adult: Secondary | ICD-10-CM | POA: Diagnosis not present

## 2019-12-19 DIAGNOSIS — Z01419 Encounter for gynecological examination (general) (routine) without abnormal findings: Secondary | ICD-10-CM | POA: Diagnosis not present

## 2019-12-19 DIAGNOSIS — Z1231 Encounter for screening mammogram for malignant neoplasm of breast: Secondary | ICD-10-CM | POA: Diagnosis not present

## 2019-12-31 DIAGNOSIS — Z23 Encounter for immunization: Secondary | ICD-10-CM | POA: Diagnosis not present

## 2020-01-02 DIAGNOSIS — E7800 Pure hypercholesterolemia, unspecified: Secondary | ICD-10-CM | POA: Diagnosis not present

## 2020-01-12 DIAGNOSIS — H04123 Dry eye syndrome of bilateral lacrimal glands: Secondary | ICD-10-CM | POA: Diagnosis not present

## 2020-01-12 DIAGNOSIS — H25813 Combined forms of age-related cataract, bilateral: Secondary | ICD-10-CM | POA: Diagnosis not present

## 2020-01-12 DIAGNOSIS — D3132 Benign neoplasm of left choroid: Secondary | ICD-10-CM | POA: Diagnosis not present

## 2020-01-12 DIAGNOSIS — H43813 Vitreous degeneration, bilateral: Secondary | ICD-10-CM | POA: Diagnosis not present

## 2020-02-27 DIAGNOSIS — R002 Palpitations: Secondary | ICD-10-CM | POA: Diagnosis not present

## 2020-02-28 DIAGNOSIS — Z5189 Encounter for other specified aftercare: Secondary | ICD-10-CM | POA: Diagnosis not present

## 2020-04-17 DIAGNOSIS — Z6826 Body mass index (BMI) 26.0-26.9, adult: Secondary | ICD-10-CM | POA: Diagnosis not present

## 2020-04-17 DIAGNOSIS — Z713 Dietary counseling and surveillance: Secondary | ICD-10-CM | POA: Diagnosis not present

## 2020-04-17 DIAGNOSIS — I10 Essential (primary) hypertension: Secondary | ICD-10-CM | POA: Diagnosis not present

## 2020-04-17 DIAGNOSIS — E039 Hypothyroidism, unspecified: Secondary | ICD-10-CM | POA: Diagnosis not present

## 2020-05-07 DIAGNOSIS — M170 Bilateral primary osteoarthritis of knee: Secondary | ICD-10-CM | POA: Diagnosis not present

## 2020-05-25 DIAGNOSIS — M1711 Unilateral primary osteoarthritis, right knee: Secondary | ICD-10-CM | POA: Diagnosis not present

## 2020-05-25 DIAGNOSIS — M25561 Pain in right knee: Secondary | ICD-10-CM | POA: Diagnosis not present

## 2020-06-01 DIAGNOSIS — M1711 Unilateral primary osteoarthritis, right knee: Secondary | ICD-10-CM | POA: Diagnosis not present

## 2020-06-01 DIAGNOSIS — M25561 Pain in right knee: Secondary | ICD-10-CM | POA: Diagnosis not present

## 2020-06-08 DIAGNOSIS — M1711 Unilateral primary osteoarthritis, right knee: Secondary | ICD-10-CM | POA: Diagnosis not present

## 2020-06-08 DIAGNOSIS — M25561 Pain in right knee: Secondary | ICD-10-CM | POA: Diagnosis not present

## 2020-06-12 DIAGNOSIS — E039 Hypothyroidism, unspecified: Secondary | ICD-10-CM | POA: Diagnosis not present

## 2020-06-12 DIAGNOSIS — E663 Overweight: Secondary | ICD-10-CM | POA: Diagnosis not present

## 2020-06-12 DIAGNOSIS — I10 Essential (primary) hypertension: Secondary | ICD-10-CM | POA: Diagnosis not present

## 2020-07-20 DIAGNOSIS — R3911 Hesitancy of micturition: Secondary | ICD-10-CM | POA: Diagnosis not present

## 2020-07-20 DIAGNOSIS — R350 Frequency of micturition: Secondary | ICD-10-CM | POA: Diagnosis not present

## 2020-08-07 DIAGNOSIS — Z6825 Body mass index (BMI) 25.0-25.9, adult: Secondary | ICD-10-CM | POA: Diagnosis not present

## 2020-08-07 DIAGNOSIS — E039 Hypothyroidism, unspecified: Secondary | ICD-10-CM | POA: Diagnosis not present

## 2020-08-07 DIAGNOSIS — E663 Overweight: Secondary | ICD-10-CM | POA: Diagnosis not present

## 2020-08-07 DIAGNOSIS — I10 Essential (primary) hypertension: Secondary | ICD-10-CM | POA: Diagnosis not present

## 2020-08-22 DIAGNOSIS — Z20822 Contact with and (suspected) exposure to covid-19: Secondary | ICD-10-CM | POA: Diagnosis not present

## 2020-10-03 DIAGNOSIS — E039 Hypothyroidism, unspecified: Secondary | ICD-10-CM | POA: Diagnosis not present

## 2020-10-03 DIAGNOSIS — E7800 Pure hypercholesterolemia, unspecified: Secondary | ICD-10-CM | POA: Diagnosis not present

## 2020-10-03 DIAGNOSIS — Z79899 Other long term (current) drug therapy: Secondary | ICD-10-CM | POA: Diagnosis not present

## 2020-10-03 DIAGNOSIS — Z0000 Encounter for general adult medical examination without abnormal findings: Secondary | ICD-10-CM | POA: Diagnosis not present

## 2020-10-03 DIAGNOSIS — I10 Essential (primary) hypertension: Secondary | ICD-10-CM | POA: Diagnosis not present

## 2021-01-07 DIAGNOSIS — M8588 Other specified disorders of bone density and structure, other site: Secondary | ICD-10-CM | POA: Diagnosis not present

## 2021-01-07 DIAGNOSIS — Z01419 Encounter for gynecological examination (general) (routine) without abnormal findings: Secondary | ICD-10-CM | POA: Diagnosis not present

## 2021-01-07 DIAGNOSIS — N958 Other specified menopausal and perimenopausal disorders: Secondary | ICD-10-CM | POA: Diagnosis not present

## 2021-01-07 DIAGNOSIS — Z6827 Body mass index (BMI) 27.0-27.9, adult: Secondary | ICD-10-CM | POA: Diagnosis not present

## 2021-01-08 DIAGNOSIS — I10 Essential (primary) hypertension: Secondary | ICD-10-CM | POA: Diagnosis not present

## 2021-01-08 DIAGNOSIS — Z713 Dietary counseling and surveillance: Secondary | ICD-10-CM | POA: Diagnosis not present

## 2021-01-08 DIAGNOSIS — E663 Overweight: Secondary | ICD-10-CM | POA: Diagnosis not present

## 2021-01-08 DIAGNOSIS — E039 Hypothyroidism, unspecified: Secondary | ICD-10-CM | POA: Diagnosis not present

## 2021-01-30 DIAGNOSIS — H43813 Vitreous degeneration, bilateral: Secondary | ICD-10-CM | POA: Diagnosis not present

## 2021-01-30 DIAGNOSIS — D3132 Benign neoplasm of left choroid: Secondary | ICD-10-CM | POA: Diagnosis not present

## 2021-01-30 DIAGNOSIS — H04123 Dry eye syndrome of bilateral lacrimal glands: Secondary | ICD-10-CM | POA: Diagnosis not present

## 2021-01-30 DIAGNOSIS — H25813 Combined forms of age-related cataract, bilateral: Secondary | ICD-10-CM | POA: Diagnosis not present

## 2021-04-01 DIAGNOSIS — M25551 Pain in right hip: Secondary | ICD-10-CM | POA: Diagnosis not present

## 2021-04-01 DIAGNOSIS — M7061 Trochanteric bursitis, right hip: Secondary | ICD-10-CM | POA: Diagnosis not present

## 2021-04-09 DIAGNOSIS — E7800 Pure hypercholesterolemia, unspecified: Secondary | ICD-10-CM | POA: Diagnosis not present

## 2021-04-24 DIAGNOSIS — Z6826 Body mass index (BMI) 26.0-26.9, adult: Secondary | ICD-10-CM | POA: Diagnosis not present

## 2021-04-24 DIAGNOSIS — M545 Low back pain, unspecified: Secondary | ICD-10-CM | POA: Diagnosis not present

## 2021-04-24 DIAGNOSIS — M4316 Spondylolisthesis, lumbar region: Secondary | ICD-10-CM | POA: Diagnosis not present

## 2021-04-24 DIAGNOSIS — M5416 Radiculopathy, lumbar region: Secondary | ICD-10-CM | POA: Diagnosis not present

## 2021-04-26 ENCOUNTER — Other Ambulatory Visit: Payer: Self-pay | Admitting: Orthopaedic Surgery

## 2021-04-26 DIAGNOSIS — M47816 Spondylosis without myelopathy or radiculopathy, lumbar region: Secondary | ICD-10-CM

## 2021-05-08 ENCOUNTER — Ambulatory Visit
Admission: RE | Admit: 2021-05-08 | Discharge: 2021-05-08 | Disposition: A | Discharge status: Home / Self Care | Payer: Medicare Other | Source: Ambulatory Visit | Attending: Orthopaedic Surgery | Admitting: Orthopaedic Surgery

## 2021-05-08 ENCOUNTER — Other Ambulatory Visit: Payer: Self-pay

## 2021-05-08 DIAGNOSIS — M545 Low back pain, unspecified: Secondary | ICD-10-CM | POA: Diagnosis not present

## 2021-05-08 DIAGNOSIS — M47816 Spondylosis without myelopathy or radiculopathy, lumbar region: Secondary | ICD-10-CM

## 2021-05-16 DIAGNOSIS — M48062 Spinal stenosis, lumbar region with neurogenic claudication: Secondary | ICD-10-CM | POA: Diagnosis not present

## 2021-05-16 DIAGNOSIS — Z6826 Body mass index (BMI) 26.0-26.9, adult: Secondary | ICD-10-CM | POA: Diagnosis not present

## 2021-05-16 DIAGNOSIS — N390 Urinary tract infection, site not specified: Secondary | ICD-10-CM | POA: Diagnosis not present

## 2021-05-16 DIAGNOSIS — M5416 Radiculopathy, lumbar region: Secondary | ICD-10-CM | POA: Diagnosis not present

## 2021-05-16 DIAGNOSIS — M4316 Spondylolisthesis, lumbar region: Secondary | ICD-10-CM | POA: Diagnosis not present

## 2021-05-22 DIAGNOSIS — Z79899 Other long term (current) drug therapy: Secondary | ICD-10-CM | POA: Diagnosis not present

## 2021-05-22 DIAGNOSIS — R1013 Epigastric pain: Secondary | ICD-10-CM | POA: Diagnosis not present

## 2021-05-22 DIAGNOSIS — R319 Hematuria, unspecified: Secondary | ICD-10-CM | POA: Diagnosis not present

## 2021-05-22 DIAGNOSIS — M5430 Sciatica, unspecified side: Secondary | ICD-10-CM | POA: Diagnosis not present

## 2021-05-28 DIAGNOSIS — M48062 Spinal stenosis, lumbar region with neurogenic claudication: Secondary | ICD-10-CM | POA: Diagnosis not present

## 2021-06-05 DIAGNOSIS — M1711 Unilateral primary osteoarthritis, right knee: Secondary | ICD-10-CM | POA: Diagnosis not present

## 2021-06-12 DIAGNOSIS — M1711 Unilateral primary osteoarthritis, right knee: Secondary | ICD-10-CM | POA: Diagnosis not present

## 2021-06-14 DIAGNOSIS — U071 COVID-19: Secondary | ICD-10-CM | POA: Diagnosis not present

## 2021-06-19 DIAGNOSIS — M1711 Unilateral primary osteoarthritis, right knee: Secondary | ICD-10-CM | POA: Diagnosis not present

## 2021-06-19 DIAGNOSIS — M1712 Unilateral primary osteoarthritis, left knee: Secondary | ICD-10-CM | POA: Diagnosis not present

## 2021-07-08 DIAGNOSIS — M5416 Radiculopathy, lumbar region: Secondary | ICD-10-CM | POA: Diagnosis not present

## 2021-07-08 DIAGNOSIS — M48062 Spinal stenosis, lumbar region with neurogenic claudication: Secondary | ICD-10-CM | POA: Diagnosis not present

## 2021-07-08 DIAGNOSIS — M4316 Spondylolisthesis, lumbar region: Secondary | ICD-10-CM | POA: Diagnosis not present

## 2021-07-16 DIAGNOSIS — M5416 Radiculopathy, lumbar region: Secondary | ICD-10-CM | POA: Diagnosis not present

## 2021-07-19 DIAGNOSIS — R350 Frequency of micturition: Secondary | ICD-10-CM | POA: Diagnosis not present

## 2021-07-19 DIAGNOSIS — R351 Nocturia: Secondary | ICD-10-CM | POA: Diagnosis not present

## 2021-07-29 DIAGNOSIS — M25562 Pain in left knee: Secondary | ICD-10-CM | POA: Diagnosis not present

## 2021-07-29 DIAGNOSIS — M1711 Unilateral primary osteoarthritis, right knee: Secondary | ICD-10-CM | POA: Diagnosis not present

## 2021-07-29 DIAGNOSIS — M25561 Pain in right knee: Secondary | ICD-10-CM | POA: Diagnosis not present

## 2021-07-29 DIAGNOSIS — M1712 Unilateral primary osteoarthritis, left knee: Secondary | ICD-10-CM | POA: Diagnosis not present

## 2021-07-31 DIAGNOSIS — M4316 Spondylolisthesis, lumbar region: Secondary | ICD-10-CM | POA: Diagnosis not present

## 2021-07-31 DIAGNOSIS — M48062 Spinal stenosis, lumbar region with neurogenic claudication: Secondary | ICD-10-CM | POA: Diagnosis not present

## 2021-07-31 DIAGNOSIS — M5416 Radiculopathy, lumbar region: Secondary | ICD-10-CM | POA: Diagnosis not present

## 2021-08-13 DIAGNOSIS — E663 Overweight: Secondary | ICD-10-CM | POA: Diagnosis not present

## 2021-08-13 DIAGNOSIS — I10 Essential (primary) hypertension: Secondary | ICD-10-CM | POA: Diagnosis not present

## 2021-08-13 DIAGNOSIS — Z6828 Body mass index (BMI) 28.0-28.9, adult: Secondary | ICD-10-CM | POA: Diagnosis not present

## 2021-08-13 DIAGNOSIS — Z713 Dietary counseling and surveillance: Secondary | ICD-10-CM | POA: Diagnosis not present

## 2021-09-11 DIAGNOSIS — F4322 Adjustment disorder with anxiety: Secondary | ICD-10-CM | POA: Diagnosis not present

## 2021-09-12 DIAGNOSIS — R948 Abnormal results of function studies of other organs and systems: Secondary | ICD-10-CM | POA: Diagnosis not present

## 2021-09-12 DIAGNOSIS — R635 Abnormal weight gain: Secondary | ICD-10-CM | POA: Diagnosis not present

## 2021-11-06 DIAGNOSIS — Z0000 Encounter for general adult medical examination without abnormal findings: Secondary | ICD-10-CM | POA: Diagnosis not present

## 2021-11-06 DIAGNOSIS — M25561 Pain in right knee: Secondary | ICD-10-CM | POA: Diagnosis not present

## 2021-11-06 DIAGNOSIS — M353 Polymyalgia rheumatica: Secondary | ICD-10-CM | POA: Diagnosis not present

## 2021-11-06 DIAGNOSIS — E7800 Pure hypercholesterolemia, unspecified: Secondary | ICD-10-CM | POA: Diagnosis not present

## 2021-11-06 DIAGNOSIS — I10 Essential (primary) hypertension: Secondary | ICD-10-CM | POA: Diagnosis not present

## 2021-11-06 DIAGNOSIS — M25562 Pain in left knee: Secondary | ICD-10-CM | POA: Diagnosis not present

## 2021-11-06 DIAGNOSIS — M1712 Unilateral primary osteoarthritis, left knee: Secondary | ICD-10-CM | POA: Diagnosis not present

## 2021-11-06 DIAGNOSIS — M1711 Unilateral primary osteoarthritis, right knee: Secondary | ICD-10-CM | POA: Diagnosis not present

## 2021-11-06 DIAGNOSIS — E039 Hypothyroidism, unspecified: Secondary | ICD-10-CM | POA: Diagnosis not present

## 2021-12-04 DIAGNOSIS — M48062 Spinal stenosis, lumbar region with neurogenic claudication: Secondary | ICD-10-CM | POA: Diagnosis not present

## 2021-12-04 DIAGNOSIS — M25551 Pain in right hip: Secondary | ICD-10-CM | POA: Diagnosis not present

## 2021-12-04 DIAGNOSIS — M5416 Radiculopathy, lumbar region: Secondary | ICD-10-CM | POA: Diagnosis not present

## 2021-12-04 DIAGNOSIS — M4316 Spondylolisthesis, lumbar region: Secondary | ICD-10-CM | POA: Diagnosis not present

## 2021-12-21 DIAGNOSIS — Z23 Encounter for immunization: Secondary | ICD-10-CM | POA: Diagnosis not present

## 2021-12-30 DIAGNOSIS — M25551 Pain in right hip: Secondary | ICD-10-CM | POA: Diagnosis not present

## 2021-12-31 DIAGNOSIS — E039 Hypothyroidism, unspecified: Secondary | ICD-10-CM | POA: Diagnosis not present

## 2022-01-08 DIAGNOSIS — Z6827 Body mass index (BMI) 27.0-27.9, adult: Secondary | ICD-10-CM | POA: Diagnosis not present

## 2022-01-08 DIAGNOSIS — Z1231 Encounter for screening mammogram for malignant neoplasm of breast: Secondary | ICD-10-CM | POA: Diagnosis not present

## 2022-01-08 DIAGNOSIS — Z01419 Encounter for gynecological examination (general) (routine) without abnormal findings: Secondary | ICD-10-CM | POA: Diagnosis not present

## 2022-01-09 DIAGNOSIS — I10 Essential (primary) hypertension: Secondary | ICD-10-CM | POA: Diagnosis not present

## 2022-01-09 DIAGNOSIS — E785 Hyperlipidemia, unspecified: Secondary | ICD-10-CM | POA: Diagnosis not present

## 2022-01-09 DIAGNOSIS — E663 Overweight: Secondary | ICD-10-CM | POA: Diagnosis not present

## 2022-01-09 DIAGNOSIS — Z6828 Body mass index (BMI) 28.0-28.9, adult: Secondary | ICD-10-CM | POA: Diagnosis not present

## 2022-01-13 DIAGNOSIS — M4316 Spondylolisthesis, lumbar region: Secondary | ICD-10-CM | POA: Diagnosis not present

## 2022-01-13 DIAGNOSIS — M48062 Spinal stenosis, lumbar region with neurogenic claudication: Secondary | ICD-10-CM | POA: Diagnosis not present

## 2022-01-13 DIAGNOSIS — Z6826 Body mass index (BMI) 26.0-26.9, adult: Secondary | ICD-10-CM | POA: Diagnosis not present

## 2022-01-13 DIAGNOSIS — M5416 Radiculopathy, lumbar region: Secondary | ICD-10-CM | POA: Diagnosis not present

## 2022-01-14 DIAGNOSIS — R9389 Abnormal findings on diagnostic imaging of other specified body structures: Secondary | ICD-10-CM | POA: Diagnosis not present

## 2022-01-17 DIAGNOSIS — M25511 Pain in right shoulder: Secondary | ICD-10-CM | POA: Diagnosis not present

## 2022-01-17 DIAGNOSIS — M7541 Impingement syndrome of right shoulder: Secondary | ICD-10-CM | POA: Diagnosis not present

## 2022-01-24 DIAGNOSIS — M25511 Pain in right shoulder: Secondary | ICD-10-CM | POA: Diagnosis not present

## 2022-02-05 DIAGNOSIS — D3132 Benign neoplasm of left choroid: Secondary | ICD-10-CM | POA: Diagnosis not present

## 2022-02-05 DIAGNOSIS — H43813 Vitreous degeneration, bilateral: Secondary | ICD-10-CM | POA: Diagnosis not present

## 2022-02-05 DIAGNOSIS — H25813 Combined forms of age-related cataract, bilateral: Secondary | ICD-10-CM | POA: Diagnosis not present

## 2022-02-05 DIAGNOSIS — H04123 Dry eye syndrome of bilateral lacrimal glands: Secondary | ICD-10-CM | POA: Diagnosis not present

## 2022-02-25 DIAGNOSIS — F4322 Adjustment disorder with anxiety: Secondary | ICD-10-CM | POA: Diagnosis not present

## 2022-03-04 DIAGNOSIS — F4322 Adjustment disorder with anxiety: Secondary | ICD-10-CM | POA: Diagnosis not present

## 2022-03-05 DIAGNOSIS — E039 Hypothyroidism, unspecified: Secondary | ICD-10-CM | POA: Diagnosis not present

## 2022-03-17 IMAGING — MR MR LUMBAR SPINE W/O CM
4 of 5 series · 26 of 48 positions shown · non-contrast
Comparison: 10/12/2018

CLINICAL DATA: Low back pain, right buttock pain.

EXAM:
MRI LUMBAR SPINE WITHOUT CONTRAST
TECHNIQUE: Multiplanar, multisequence MR imaging of the lumbar spine was
performed. No intravenous contrast was administered.

[Series 3: T2 · sagittal · 4.0mm · 1.09mm/px · 6 of 17 slices shown (1 of 2)]
[im 1/17]
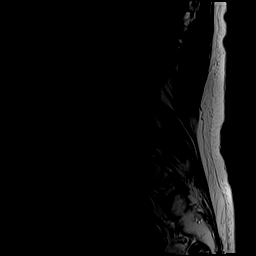
[im 4/17]
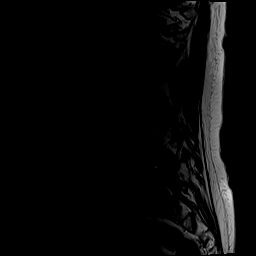
[im 7/17]
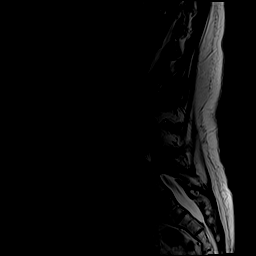
[im 10/17]
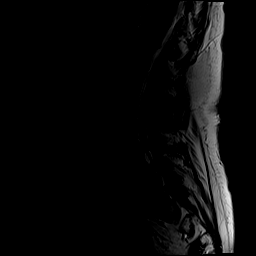
[im 13/17]
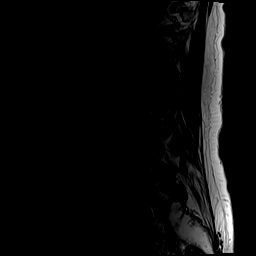
[im 17/17]
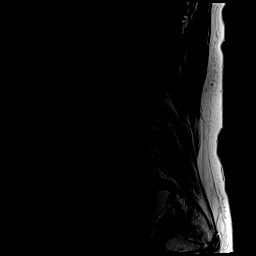

[Series 5: T1 · sagittal · 4.0mm · 1.09mm/px · 6 of 17 slices shown (1 of 2)]
[im 1/17]
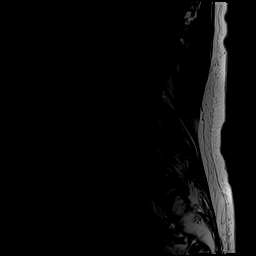
[im 4/17]
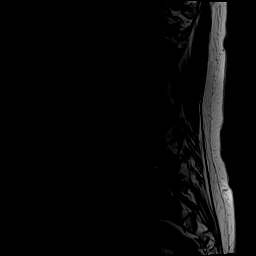
[im 7/17]
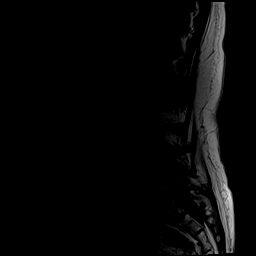
[im 10/17]
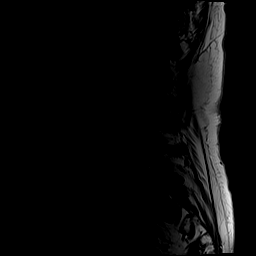
[im 13/17]
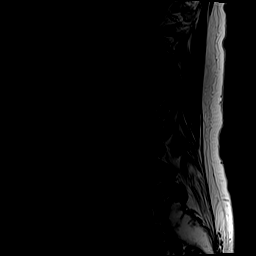
[im 17/17]
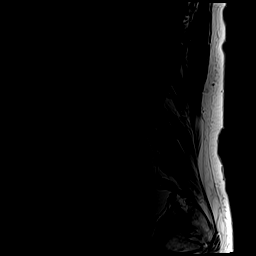

[Series 6: T2 · axial · 4.0mm · 0.39mm/px · z∈[-116,+104]mm · 9 of 40 slices shown (2 of 2)]
[im 1/40]
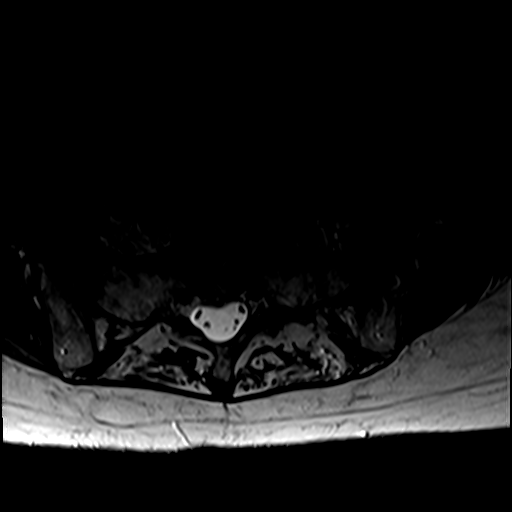
[im 6/40]
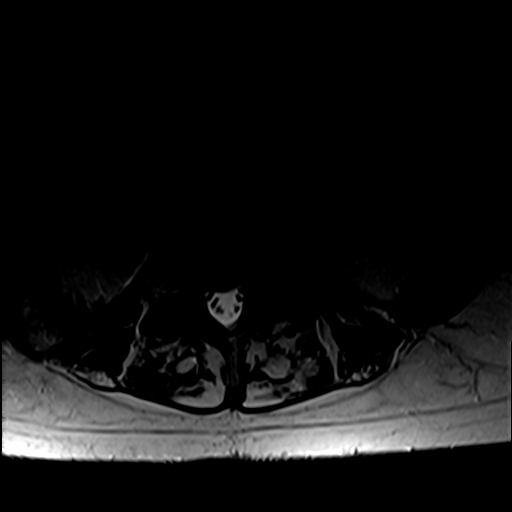
[im 12/40]
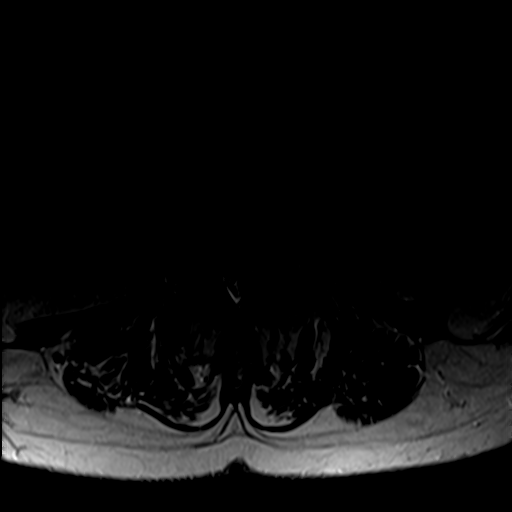
[im 17/40]
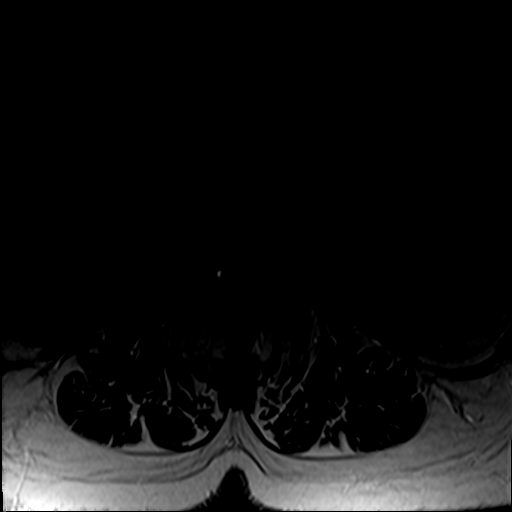
[im 20/40]
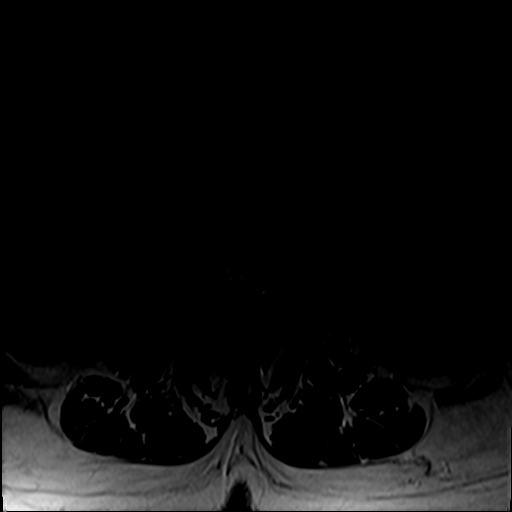
[im 23/40]
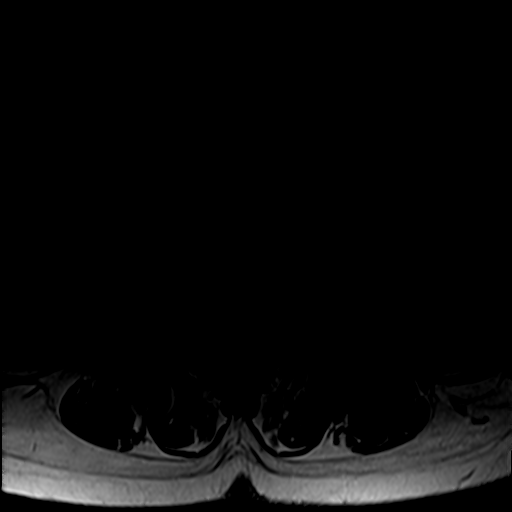
[im 28/40]
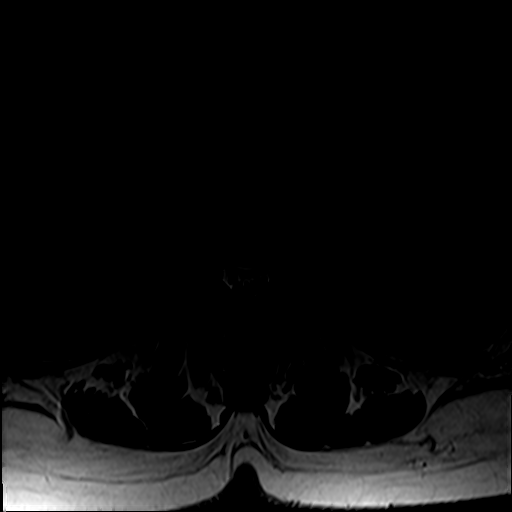
[im 34/40]
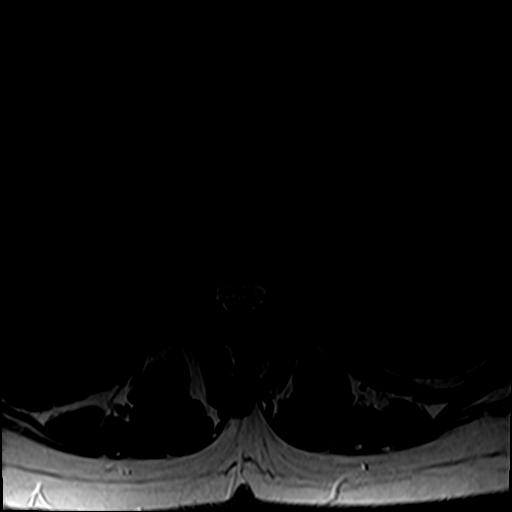
[im 40/40]
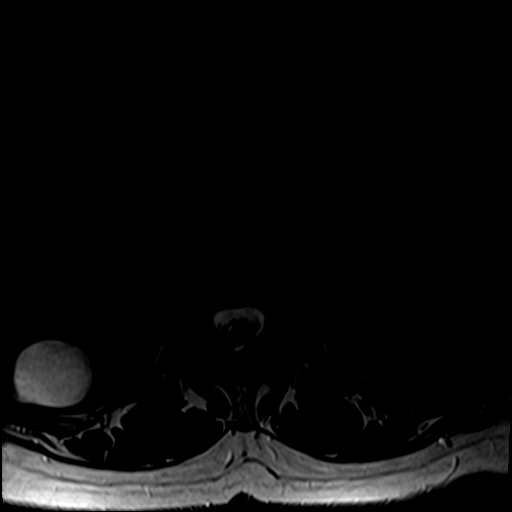

[Series 7: T1 · axial · 4.0mm · 0.39mm/px · z∈[-116,+75]mm · 5 of 40 slices shown (2 of 2)]
[im 1/40]
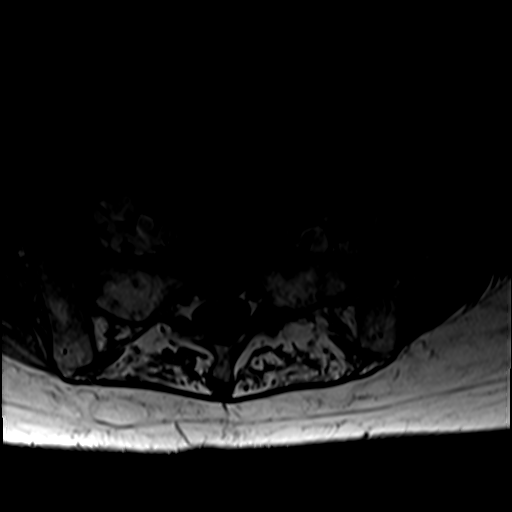
[im 6/40]
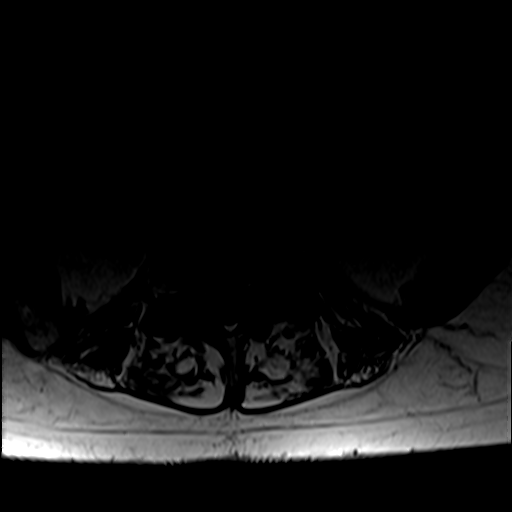
[im 12/40]
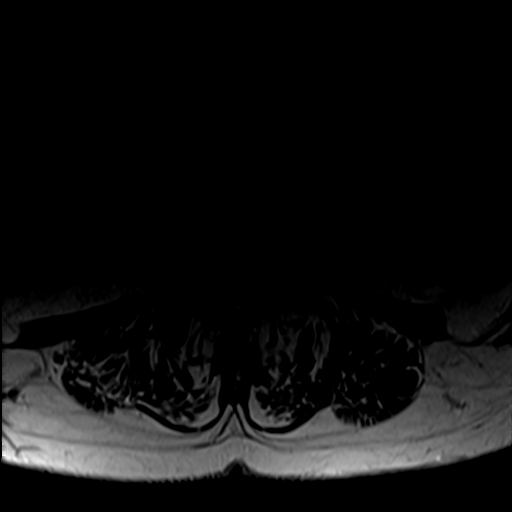
[im 20/40]
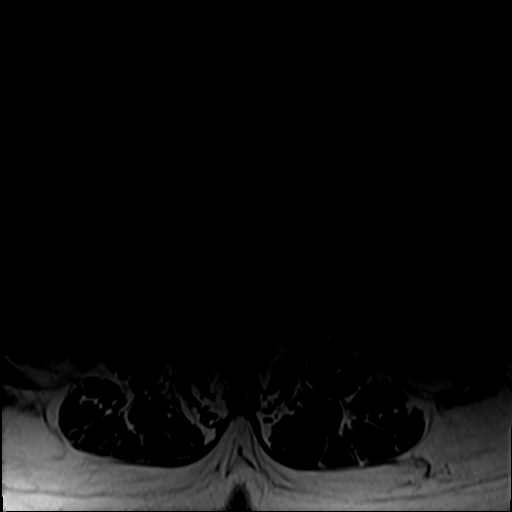
[im 34/40]
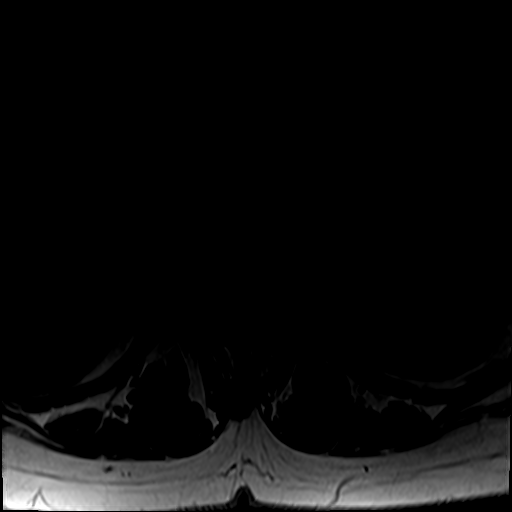

[26 of 48 positions shown; findings below may reference images not displayed]

FINDINGS: Segmentation:  Standard.

Alignment: Grade 1 anterolisthesis of L2 on L3, L3 on L4 and L4 on
L5 secondary to facet disease.

Vertebrae: No acute fracture, evidence of discitis, or aggressive
bone lesion.

Conus medullaris and cauda equina: Conus extends to the T12 level.
Conus and cauda equina appear normal.

Paraspinal and other soft tissues: No acute paraspinal abnormality.
3 cm right hepatic cyst again noted.

Disc levels:

Disc spaces: Degenerative disease with disc height loss at T11-12,
L2-3, L3-4, and L4-5. Disc desiccation throughout the lumbar spine.

T12-L1: Minimal broad-based disc bulge. Mild bilateral facet
arthropathy. Mild right and moderate left foraminal stenosis. No
spinal stenosis.

L1-L2: No significant disc bulge. No neural foraminal stenosis. No
central canal stenosis. Mild left facet arthropathy.

L2-L3: Mild broad-based disc bulge. Mild bilateral facet
arthropathy. Mild spinal stenosis. Bilateral lateral recess
narrowing. Mild left foraminal stenosis. No right foraminal
stenosis.

L3-L4: Mild broad-based disc bulge. Severe bilateral facet
arthropathy. Severe spinal stenosis. Moderate left foraminal
stenosis. No right foraminal stenosis.

L4-L5: Mild broad-based disc bulge. Severe bilateral facet
arthropathy with ligamentum flavum infolding. Moderate-severe spinal
stenosis. No foraminal stenosis.

L5-S1: Mild broad-based disc bulge with a small central annular
fissure. Moderate bilateral facet arthropathy. No foraminal
stenosis.
IMPRESSION: 1. At L3-4 there is a mild broad-based disc bulge. Severe bilateral
facet arthropathy. Severe spinal stenosis. Moderate left foraminal
stenosis. No right foraminal stenosis.
2. At L4-5 there is a mild broad-based disc bulge. Severe bilateral
facet arthropathy with ligamentum flavum infolding. Moderate-severe
spinal stenosis.
3. At L2-3 there is a mild broad-based disc bulge. Mild bilateral
facet arthropathy. Mild spinal stenosis. Bilateral lateral recess
narrowing. Mild left foraminal stenosis.
4.  No acute osseous injury of the lumbar spine.

## 2022-03-27 DIAGNOSIS — M4316 Spondylolisthesis, lumbar region: Secondary | ICD-10-CM | POA: Diagnosis not present

## 2022-03-27 DIAGNOSIS — M5416 Radiculopathy, lumbar region: Secondary | ICD-10-CM | POA: Diagnosis not present

## 2022-03-27 DIAGNOSIS — M48062 Spinal stenosis, lumbar region with neurogenic claudication: Secondary | ICD-10-CM | POA: Diagnosis not present

## 2022-03-27 DIAGNOSIS — M25551 Pain in right hip: Secondary | ICD-10-CM | POA: Diagnosis not present

## 2022-03-27 DIAGNOSIS — M5106 Intervertebral disc disorders with myelopathy, lumbar region: Secondary | ICD-10-CM | POA: Diagnosis not present

## 2022-04-09 DIAGNOSIS — Z6828 Body mass index (BMI) 28.0-28.9, adult: Secondary | ICD-10-CM | POA: Diagnosis not present

## 2022-04-09 DIAGNOSIS — I10 Essential (primary) hypertension: Secondary | ICD-10-CM | POA: Diagnosis not present

## 2022-04-09 DIAGNOSIS — E669 Obesity, unspecified: Secondary | ICD-10-CM | POA: Diagnosis not present

## 2022-04-09 DIAGNOSIS — E785 Hyperlipidemia, unspecified: Secondary | ICD-10-CM | POA: Diagnosis not present

## 2022-04-14 DIAGNOSIS — F4322 Adjustment disorder with anxiety: Secondary | ICD-10-CM | POA: Diagnosis not present

## 2022-04-23 DIAGNOSIS — M4316 Spondylolisthesis, lumbar region: Secondary | ICD-10-CM | POA: Diagnosis not present

## 2022-04-23 DIAGNOSIS — M48062 Spinal stenosis, lumbar region with neurogenic claudication: Secondary | ICD-10-CM | POA: Diagnosis not present

## 2022-04-30 DIAGNOSIS — M5416 Radiculopathy, lumbar region: Secondary | ICD-10-CM | POA: Diagnosis not present

## 2022-05-01 DIAGNOSIS — E039 Hypothyroidism, unspecified: Secondary | ICD-10-CM | POA: Diagnosis not present

## 2022-05-21 DIAGNOSIS — M4316 Spondylolisthesis, lumbar region: Secondary | ICD-10-CM | POA: Diagnosis not present

## 2022-05-21 DIAGNOSIS — M48062 Spinal stenosis, lumbar region with neurogenic claudication: Secondary | ICD-10-CM | POA: Diagnosis not present

## 2022-05-23 ENCOUNTER — Other Ambulatory Visit: Payer: Self-pay | Admitting: Orthopaedic Surgery

## 2022-05-23 DIAGNOSIS — M5416 Radiculopathy, lumbar region: Secondary | ICD-10-CM

## 2022-05-29 DIAGNOSIS — M170 Bilateral primary osteoarthritis of knee: Secondary | ICD-10-CM | POA: Diagnosis not present

## 2022-06-05 DIAGNOSIS — M170 Bilateral primary osteoarthritis of knee: Secondary | ICD-10-CM | POA: Diagnosis not present

## 2022-06-05 DIAGNOSIS — E785 Hyperlipidemia, unspecified: Secondary | ICD-10-CM | POA: Diagnosis not present

## 2022-06-05 DIAGNOSIS — E039 Hypothyroidism, unspecified: Secondary | ICD-10-CM | POA: Diagnosis not present

## 2022-06-05 DIAGNOSIS — I10 Essential (primary) hypertension: Secondary | ICD-10-CM | POA: Diagnosis not present

## 2022-06-05 DIAGNOSIS — Z6828 Body mass index (BMI) 28.0-28.9, adult: Secondary | ICD-10-CM | POA: Diagnosis not present

## 2022-06-06 ENCOUNTER — Ambulatory Visit
Admission: RE | Admit: 2022-06-06 | Discharge: 2022-06-06 | Disposition: A | Discharge status: Home / Self Care | Payer: Medicare Other | Source: Ambulatory Visit | Attending: Orthopaedic Surgery | Admitting: Orthopaedic Surgery

## 2022-06-06 DIAGNOSIS — E039 Hypothyroidism, unspecified: Secondary | ICD-10-CM | POA: Diagnosis not present

## 2022-06-06 DIAGNOSIS — R200 Anesthesia of skin: Secondary | ICD-10-CM | POA: Diagnosis not present

## 2022-06-06 DIAGNOSIS — M5416 Radiculopathy, lumbar region: Secondary | ICD-10-CM

## 2022-06-06 DIAGNOSIS — M48061 Spinal stenosis, lumbar region without neurogenic claudication: Secondary | ICD-10-CM | POA: Diagnosis not present

## 2022-06-06 DIAGNOSIS — M545 Low back pain, unspecified: Secondary | ICD-10-CM | POA: Diagnosis not present

## 2022-06-12 DIAGNOSIS — M170 Bilateral primary osteoarthritis of knee: Secondary | ICD-10-CM | POA: Diagnosis not present

## 2022-06-14 DIAGNOSIS — N390 Urinary tract infection, site not specified: Secondary | ICD-10-CM | POA: Diagnosis not present

## 2022-06-30 DIAGNOSIS — M1712 Unilateral primary osteoarthritis, left knee: Secondary | ICD-10-CM | POA: Diagnosis not present

## 2022-07-01 DIAGNOSIS — M48062 Spinal stenosis, lumbar region with neurogenic claudication: Secondary | ICD-10-CM | POA: Diagnosis not present

## 2022-07-01 DIAGNOSIS — M5416 Radiculopathy, lumbar region: Secondary | ICD-10-CM | POA: Diagnosis not present

## 2022-07-01 DIAGNOSIS — M4316 Spondylolisthesis, lumbar region: Secondary | ICD-10-CM | POA: Diagnosis not present

## 2022-07-18 DIAGNOSIS — M47816 Spondylosis without myelopathy or radiculopathy, lumbar region: Secondary | ICD-10-CM | POA: Diagnosis not present

## 2022-07-18 DIAGNOSIS — M48062 Spinal stenosis, lumbar region with neurogenic claudication: Secondary | ICD-10-CM | POA: Diagnosis not present

## 2022-07-18 DIAGNOSIS — M5106 Intervertebral disc disorders with myelopathy, lumbar region: Secondary | ICD-10-CM | POA: Diagnosis not present

## 2022-07-22 DIAGNOSIS — R310 Gross hematuria: Secondary | ICD-10-CM | POA: Diagnosis not present

## 2022-07-22 DIAGNOSIS — R3911 Hesitancy of micturition: Secondary | ICD-10-CM | POA: Diagnosis not present

## 2022-07-24 DIAGNOSIS — M6281 Muscle weakness (generalized): Secondary | ICD-10-CM | POA: Diagnosis not present

## 2022-07-24 DIAGNOSIS — R260 Ataxic gait: Secondary | ICD-10-CM | POA: Diagnosis not present

## 2022-07-24 DIAGNOSIS — M5416 Radiculopathy, lumbar region: Secondary | ICD-10-CM | POA: Diagnosis not present

## 2022-08-04 DIAGNOSIS — R260 Ataxic gait: Secondary | ICD-10-CM | POA: Diagnosis not present

## 2022-08-04 DIAGNOSIS — M6281 Muscle weakness (generalized): Secondary | ICD-10-CM | POA: Diagnosis not present

## 2022-08-04 DIAGNOSIS — M5416 Radiculopathy, lumbar region: Secondary | ICD-10-CM | POA: Diagnosis not present

## 2022-08-05 DIAGNOSIS — R310 Gross hematuria: Secondary | ICD-10-CM | POA: Diagnosis not present

## 2022-08-06 DIAGNOSIS — M5416 Radiculopathy, lumbar region: Secondary | ICD-10-CM | POA: Diagnosis not present

## 2022-08-06 DIAGNOSIS — R260 Ataxic gait: Secondary | ICD-10-CM | POA: Diagnosis not present

## 2022-08-06 DIAGNOSIS — M6281 Muscle weakness (generalized): Secondary | ICD-10-CM | POA: Diagnosis not present

## 2022-08-13 DIAGNOSIS — M6281 Muscle weakness (generalized): Secondary | ICD-10-CM | POA: Diagnosis not present

## 2022-08-13 DIAGNOSIS — R260 Ataxic gait: Secondary | ICD-10-CM | POA: Diagnosis not present

## 2022-08-13 DIAGNOSIS — M5416 Radiculopathy, lumbar region: Secondary | ICD-10-CM | POA: Diagnosis not present

## 2022-08-14 DIAGNOSIS — R310 Gross hematuria: Secondary | ICD-10-CM | POA: Diagnosis not present

## 2022-08-14 DIAGNOSIS — R319 Hematuria, unspecified: Secondary | ICD-10-CM | POA: Diagnosis not present

## 2022-08-14 DIAGNOSIS — K5730 Diverticulosis of large intestine without perforation or abscess without bleeding: Secondary | ICD-10-CM | POA: Diagnosis not present

## 2022-08-15 DIAGNOSIS — M6281 Muscle weakness (generalized): Secondary | ICD-10-CM | POA: Diagnosis not present

## 2022-08-15 DIAGNOSIS — R260 Ataxic gait: Secondary | ICD-10-CM | POA: Diagnosis not present

## 2022-08-15 DIAGNOSIS — M5416 Radiculopathy, lumbar region: Secondary | ICD-10-CM | POA: Diagnosis not present

## 2022-08-22 DIAGNOSIS — R260 Ataxic gait: Secondary | ICD-10-CM | POA: Diagnosis not present

## 2022-08-22 DIAGNOSIS — M6281 Muscle weakness (generalized): Secondary | ICD-10-CM | POA: Diagnosis not present

## 2022-08-22 DIAGNOSIS — M5416 Radiculopathy, lumbar region: Secondary | ICD-10-CM | POA: Diagnosis not present

## 2022-08-25 DIAGNOSIS — M47816 Spondylosis without myelopathy or radiculopathy, lumbar region: Secondary | ICD-10-CM | POA: Diagnosis not present

## 2022-08-26 DIAGNOSIS — M6281 Muscle weakness (generalized): Secondary | ICD-10-CM | POA: Diagnosis not present

## 2022-08-26 DIAGNOSIS — R260 Ataxic gait: Secondary | ICD-10-CM | POA: Diagnosis not present

## 2022-08-26 DIAGNOSIS — M5416 Radiculopathy, lumbar region: Secondary | ICD-10-CM | POA: Diagnosis not present

## 2022-08-27 DIAGNOSIS — R310 Gross hematuria: Secondary | ICD-10-CM | POA: Diagnosis not present

## 2022-08-29 DIAGNOSIS — M6281 Muscle weakness (generalized): Secondary | ICD-10-CM | POA: Diagnosis not present

## 2022-08-29 DIAGNOSIS — M5416 Radiculopathy, lumbar region: Secondary | ICD-10-CM | POA: Diagnosis not present

## 2022-08-29 DIAGNOSIS — R260 Ataxic gait: Secondary | ICD-10-CM | POA: Diagnosis not present

## 2022-09-01 DIAGNOSIS — M5416 Radiculopathy, lumbar region: Secondary | ICD-10-CM | POA: Diagnosis not present

## 2022-09-01 DIAGNOSIS — R260 Ataxic gait: Secondary | ICD-10-CM | POA: Diagnosis not present

## 2022-09-01 DIAGNOSIS — M6281 Muscle weakness (generalized): Secondary | ICD-10-CM | POA: Diagnosis not present

## 2022-09-03 DIAGNOSIS — M170 Bilateral primary osteoarthritis of knee: Secondary | ICD-10-CM | POA: Diagnosis not present

## 2022-09-08 DIAGNOSIS — M6281 Muscle weakness (generalized): Secondary | ICD-10-CM | POA: Diagnosis not present

## 2022-09-08 DIAGNOSIS — R260 Ataxic gait: Secondary | ICD-10-CM | POA: Diagnosis not present

## 2022-09-08 DIAGNOSIS — M5416 Radiculopathy, lumbar region: Secondary | ICD-10-CM | POA: Diagnosis not present

## 2022-09-11 DIAGNOSIS — M5416 Radiculopathy, lumbar region: Secondary | ICD-10-CM | POA: Diagnosis not present

## 2022-09-11 DIAGNOSIS — R260 Ataxic gait: Secondary | ICD-10-CM | POA: Diagnosis not present

## 2022-09-11 DIAGNOSIS — M6281 Muscle weakness (generalized): Secondary | ICD-10-CM | POA: Diagnosis not present

## 2022-09-15 DIAGNOSIS — E039 Hypothyroidism, unspecified: Secondary | ICD-10-CM | POA: Diagnosis not present

## 2022-09-15 DIAGNOSIS — M353 Polymyalgia rheumatica: Secondary | ICD-10-CM | POA: Diagnosis not present

## 2022-09-15 DIAGNOSIS — M6281 Muscle weakness (generalized): Secondary | ICD-10-CM | POA: Diagnosis not present

## 2022-09-15 DIAGNOSIS — R260 Ataxic gait: Secondary | ICD-10-CM | POA: Diagnosis not present

## 2022-09-15 DIAGNOSIS — M5416 Radiculopathy, lumbar region: Secondary | ICD-10-CM | POA: Diagnosis not present

## 2022-09-22 DIAGNOSIS — M1712 Unilateral primary osteoarthritis, left knee: Secondary | ICD-10-CM | POA: Diagnosis not present

## 2022-09-22 DIAGNOSIS — M48062 Spinal stenosis, lumbar region with neurogenic claudication: Secondary | ICD-10-CM | POA: Diagnosis not present

## 2022-10-01 DIAGNOSIS — E039 Hypothyroidism, unspecified: Secondary | ICD-10-CM | POA: Diagnosis not present

## 2022-10-01 DIAGNOSIS — Z713 Dietary counseling and surveillance: Secondary | ICD-10-CM | POA: Diagnosis not present

## 2022-10-01 DIAGNOSIS — I10 Essential (primary) hypertension: Secondary | ICD-10-CM | POA: Diagnosis not present

## 2022-10-01 DIAGNOSIS — E663 Overweight: Secondary | ICD-10-CM | POA: Diagnosis not present

## 2022-10-13 DIAGNOSIS — M5106 Intervertebral disc disorders with myelopathy, lumbar region: Secondary | ICD-10-CM | POA: Diagnosis not present

## 2022-10-13 DIAGNOSIS — M47816 Spondylosis without myelopathy or radiculopathy, lumbar region: Secondary | ICD-10-CM | POA: Diagnosis not present

## 2022-10-13 DIAGNOSIS — M48062 Spinal stenosis, lumbar region with neurogenic claudication: Secondary | ICD-10-CM | POA: Diagnosis not present

## 2022-11-20 DIAGNOSIS — I700 Atherosclerosis of aorta: Secondary | ICD-10-CM | POA: Diagnosis not present

## 2022-11-20 DIAGNOSIS — E7800 Pure hypercholesterolemia, unspecified: Secondary | ICD-10-CM | POA: Diagnosis not present

## 2022-11-20 DIAGNOSIS — M353 Polymyalgia rheumatica: Secondary | ICD-10-CM | POA: Diagnosis not present

## 2022-11-20 DIAGNOSIS — E039 Hypothyroidism, unspecified: Secondary | ICD-10-CM | POA: Diagnosis not present

## 2022-11-20 DIAGNOSIS — I10 Essential (primary) hypertension: Secondary | ICD-10-CM | POA: Diagnosis not present

## 2022-11-20 DIAGNOSIS — Z0000 Encounter for general adult medical examination without abnormal findings: Secondary | ICD-10-CM | POA: Diagnosis not present

## 2022-12-03 DIAGNOSIS — I10 Essential (primary) hypertension: Secondary | ICD-10-CM | POA: Diagnosis not present

## 2022-12-03 DIAGNOSIS — Z713 Dietary counseling and surveillance: Secondary | ICD-10-CM | POA: Diagnosis not present

## 2022-12-03 DIAGNOSIS — E039 Hypothyroidism, unspecified: Secondary | ICD-10-CM | POA: Diagnosis not present

## 2022-12-03 DIAGNOSIS — Z6829 Body mass index (BMI) 29.0-29.9, adult: Secondary | ICD-10-CM | POA: Diagnosis not present

## 2022-12-10 DIAGNOSIS — K080 Exfoliation of teeth due to systemic causes: Secondary | ICD-10-CM | POA: Diagnosis not present

## 2022-12-17 DIAGNOSIS — L309 Dermatitis, unspecified: Secondary | ICD-10-CM | POA: Diagnosis not present

## 2022-12-30 DIAGNOSIS — M130 Polyarthritis, unspecified: Secondary | ICD-10-CM | POA: Diagnosis not present

## 2022-12-30 DIAGNOSIS — L299 Pruritus, unspecified: Secondary | ICD-10-CM | POA: Diagnosis not present

## 2023-01-03 DIAGNOSIS — Z23 Encounter for immunization: Secondary | ICD-10-CM | POA: Diagnosis not present

## 2023-01-07 DIAGNOSIS — M549 Dorsalgia, unspecified: Secondary | ICD-10-CM | POA: Diagnosis not present

## 2023-01-07 DIAGNOSIS — Z8739 Personal history of other diseases of the musculoskeletal system and connective tissue: Secondary | ICD-10-CM | POA: Diagnosis not present

## 2023-01-07 DIAGNOSIS — M25562 Pain in left knee: Secondary | ICD-10-CM | POA: Diagnosis not present

## 2023-01-07 DIAGNOSIS — M79641 Pain in right hand: Secondary | ICD-10-CM | POA: Diagnosis not present

## 2023-01-07 DIAGNOSIS — M79642 Pain in left hand: Secondary | ICD-10-CM | POA: Diagnosis not present

## 2023-01-07 DIAGNOSIS — M2550 Pain in unspecified joint: Secondary | ICD-10-CM | POA: Diagnosis not present

## 2023-01-07 DIAGNOSIS — M25561 Pain in right knee: Secondary | ICD-10-CM | POA: Diagnosis not present

## 2023-01-07 DIAGNOSIS — M1990 Unspecified osteoarthritis, unspecified site: Secondary | ICD-10-CM | POA: Diagnosis not present

## 2023-01-07 DIAGNOSIS — R21 Rash and other nonspecific skin eruption: Secondary | ICD-10-CM | POA: Diagnosis not present

## 2023-01-09 DIAGNOSIS — R768 Other specified abnormal immunological findings in serum: Secondary | ICD-10-CM | POA: Diagnosis not present

## 2023-01-09 DIAGNOSIS — M2550 Pain in unspecified joint: Secondary | ICD-10-CM | POA: Diagnosis not present

## 2023-01-13 DIAGNOSIS — Z01419 Encounter for gynecological examination (general) (routine) without abnormal findings: Secondary | ICD-10-CM | POA: Diagnosis not present

## 2023-01-13 DIAGNOSIS — Z6826 Body mass index (BMI) 26.0-26.9, adult: Secondary | ICD-10-CM | POA: Diagnosis not present

## 2023-01-13 DIAGNOSIS — Z1231 Encounter for screening mammogram for malignant neoplasm of breast: Secondary | ICD-10-CM | POA: Diagnosis not present

## 2023-01-30 DIAGNOSIS — S51859A Open bite of unspecified forearm, initial encounter: Secondary | ICD-10-CM | POA: Diagnosis not present

## 2023-02-09 DIAGNOSIS — D3132 Benign neoplasm of left choroid: Secondary | ICD-10-CM | POA: Diagnosis not present

## 2023-02-09 DIAGNOSIS — H43813 Vitreous degeneration, bilateral: Secondary | ICD-10-CM | POA: Diagnosis not present

## 2023-02-09 DIAGNOSIS — H04123 Dry eye syndrome of bilateral lacrimal glands: Secondary | ICD-10-CM | POA: Diagnosis not present

## 2023-02-09 DIAGNOSIS — H25813 Combined forms of age-related cataract, bilateral: Secondary | ICD-10-CM | POA: Diagnosis not present

## 2023-02-24 DIAGNOSIS — Z8739 Personal history of other diseases of the musculoskeletal system and connective tissue: Secondary | ICD-10-CM | POA: Diagnosis not present

## 2023-02-24 DIAGNOSIS — M1990 Unspecified osteoarthritis, unspecified site: Secondary | ICD-10-CM | POA: Diagnosis not present

## 2023-02-24 DIAGNOSIS — R21 Rash and other nonspecific skin eruption: Secondary | ICD-10-CM | POA: Diagnosis not present

## 2023-02-24 DIAGNOSIS — M2550 Pain in unspecified joint: Secondary | ICD-10-CM | POA: Diagnosis not present

## 2023-03-05 DIAGNOSIS — Z713 Dietary counseling and surveillance: Secondary | ICD-10-CM | POA: Diagnosis not present

## 2023-03-05 DIAGNOSIS — E039 Hypothyroidism, unspecified: Secondary | ICD-10-CM | POA: Diagnosis not present

## 2023-03-05 DIAGNOSIS — I10 Essential (primary) hypertension: Secondary | ICD-10-CM | POA: Diagnosis not present

## 2023-03-05 DIAGNOSIS — E663 Overweight: Secondary | ICD-10-CM | POA: Diagnosis not present

## 2023-03-16 DIAGNOSIS — Z460 Encounter for fitting and adjustment of spectacles and contact lenses: Secondary | ICD-10-CM | POA: Diagnosis not present

## 2023-04-16 DIAGNOSIS — J988 Other specified respiratory disorders: Secondary | ICD-10-CM | POA: Diagnosis not present

## 2023-04-23 DIAGNOSIS — M170 Bilateral primary osteoarthritis of knee: Secondary | ICD-10-CM | POA: Diagnosis not present

## 2023-04-24 DIAGNOSIS — D3132 Benign neoplasm of left choroid: Secondary | ICD-10-CM | POA: Diagnosis not present

## 2023-04-24 DIAGNOSIS — H25813 Combined forms of age-related cataract, bilateral: Secondary | ICD-10-CM | POA: Diagnosis not present

## 2023-04-24 DIAGNOSIS — H04123 Dry eye syndrome of bilateral lacrimal glands: Secondary | ICD-10-CM | POA: Diagnosis not present

## 2023-04-24 DIAGNOSIS — H43813 Vitreous degeneration, bilateral: Secondary | ICD-10-CM | POA: Diagnosis not present

## 2023-04-29 DIAGNOSIS — M170 Bilateral primary osteoarthritis of knee: Secondary | ICD-10-CM | POA: Diagnosis not present

## 2023-05-06 DIAGNOSIS — H6980 Other specified disorders of Eustachian tube, unspecified ear: Secondary | ICD-10-CM | POA: Diagnosis not present

## 2023-05-07 DIAGNOSIS — M170 Bilateral primary osteoarthritis of knee: Secondary | ICD-10-CM | POA: Diagnosis not present

## 2023-05-28 DIAGNOSIS — J069 Acute upper respiratory infection, unspecified: Secondary | ICD-10-CM | POA: Diagnosis not present

## 2023-05-28 DIAGNOSIS — H9201 Otalgia, right ear: Secondary | ICD-10-CM | POA: Diagnosis not present

## 2023-05-28 DIAGNOSIS — J3801 Paralysis of vocal cords and larynx, unilateral: Secondary | ICD-10-CM | POA: Diagnosis not present

## 2023-05-28 DIAGNOSIS — J383 Other diseases of vocal cords: Secondary | ICD-10-CM | POA: Diagnosis not present

## 2023-05-28 DIAGNOSIS — E890 Postprocedural hypothyroidism: Secondary | ICD-10-CM | POA: Diagnosis not present

## 2023-05-28 DIAGNOSIS — H9209 Otalgia, unspecified ear: Secondary | ICD-10-CM | POA: Diagnosis not present

## 2023-05-28 DIAGNOSIS — R0982 Postnasal drip: Secondary | ICD-10-CM | POA: Diagnosis not present

## 2023-05-28 DIAGNOSIS — R490 Dysphonia: Secondary | ICD-10-CM | POA: Diagnosis not present

## 2023-07-08 DIAGNOSIS — R339 Retention of urine, unspecified: Secondary | ICD-10-CM | POA: Diagnosis not present

## 2023-07-08 DIAGNOSIS — R350 Frequency of micturition: Secondary | ICD-10-CM | POA: Diagnosis not present

## 2023-07-08 DIAGNOSIS — I10 Essential (primary) hypertension: Secondary | ICD-10-CM | POA: Diagnosis not present

## 2023-07-16 DIAGNOSIS — Z713 Dietary counseling and surveillance: Secondary | ICD-10-CM | POA: Diagnosis not present

## 2023-07-16 DIAGNOSIS — I10 Essential (primary) hypertension: Secondary | ICD-10-CM | POA: Diagnosis not present

## 2023-07-16 DIAGNOSIS — E039 Hypothyroidism, unspecified: Secondary | ICD-10-CM | POA: Diagnosis not present

## 2023-07-16 DIAGNOSIS — E663 Overweight: Secondary | ICD-10-CM | POA: Diagnosis not present

## 2023-07-20 DIAGNOSIS — M48062 Spinal stenosis, lumbar region with neurogenic claudication: Secondary | ICD-10-CM | POA: Diagnosis not present

## 2023-07-20 DIAGNOSIS — M791 Myalgia, unspecified site: Secondary | ICD-10-CM | POA: Diagnosis not present

## 2023-07-20 DIAGNOSIS — M47816 Spondylosis without myelopathy or radiculopathy, lumbar region: Secondary | ICD-10-CM | POA: Diagnosis not present

## 2023-07-21 DIAGNOSIS — R3911 Hesitancy of micturition: Secondary | ICD-10-CM | POA: Diagnosis not present

## 2023-07-21 DIAGNOSIS — R351 Nocturia: Secondary | ICD-10-CM | POA: Diagnosis not present

## 2023-07-27 DIAGNOSIS — E871 Hypo-osmolality and hyponatremia: Secondary | ICD-10-CM | POA: Diagnosis not present

## 2023-07-27 DIAGNOSIS — E039 Hypothyroidism, unspecified: Secondary | ICD-10-CM | POA: Diagnosis not present

## 2023-08-12 DIAGNOSIS — M48061 Spinal stenosis, lumbar region without neurogenic claudication: Secondary | ICD-10-CM | POA: Diagnosis not present

## 2023-08-12 DIAGNOSIS — R252 Cramp and spasm: Secondary | ICD-10-CM | POA: Diagnosis not present

## 2023-08-12 DIAGNOSIS — I10 Essential (primary) hypertension: Secondary | ICD-10-CM | POA: Diagnosis not present

## 2023-08-12 DIAGNOSIS — M545 Low back pain, unspecified: Secondary | ICD-10-CM | POA: Diagnosis not present

## 2023-08-12 DIAGNOSIS — R351 Nocturia: Secondary | ICD-10-CM | POA: Diagnosis not present

## 2023-08-12 DIAGNOSIS — Z1330 Encounter for screening examination for mental health and behavioral disorders, unspecified: Secondary | ICD-10-CM | POA: Diagnosis not present

## 2023-08-12 DIAGNOSIS — G894 Chronic pain syndrome: Secondary | ICD-10-CM | POA: Diagnosis not present

## 2023-08-12 DIAGNOSIS — F418 Other specified anxiety disorders: Secondary | ICD-10-CM | POA: Diagnosis not present

## 2023-08-23 DIAGNOSIS — I340 Nonrheumatic mitral (valve) insufficiency: Secondary | ICD-10-CM

## 2023-08-23 HISTORY — DX: Nonrheumatic mitral (valve) insufficiency: I34.0

## 2023-08-28 ENCOUNTER — Other Ambulatory Visit: Payer: Self-pay

## 2023-08-28 ENCOUNTER — Emergency Department (HOSPITAL_COMMUNITY)
Admission: EM | Admit: 2023-08-28 | Discharge: 2023-08-29 | Disposition: A | Discharge status: Home / Self Care | Attending: Emergency Medicine | Admitting: Emergency Medicine

## 2023-08-28 ENCOUNTER — Encounter (HOSPITAL_COMMUNITY): Payer: Self-pay | Admitting: *Deleted

## 2023-08-28 DIAGNOSIS — I10 Essential (primary) hypertension: Secondary | ICD-10-CM | POA: Diagnosis not present

## 2023-08-28 DIAGNOSIS — I6523 Occlusion and stenosis of bilateral carotid arteries: Secondary | ICD-10-CM | POA: Diagnosis not present

## 2023-08-28 DIAGNOSIS — Z79899 Other long term (current) drug therapy: Secondary | ICD-10-CM | POA: Insufficient documentation

## 2023-08-28 DIAGNOSIS — I700 Atherosclerosis of aorta: Secondary | ICD-10-CM | POA: Diagnosis not present

## 2023-08-28 DIAGNOSIS — R42 Dizziness and giddiness: Secondary | ICD-10-CM

## 2023-08-28 LAB — URINALYSIS, ROUTINE W REFLEX MICROSCOPIC
Bilirubin Urine: NEGATIVE
Glucose, UA: NEGATIVE mg/dL
Ketones, ur: 5 mg/dL — AB
Nitrite: NEGATIVE
Protein, ur: NEGATIVE mg/dL
Specific Gravity, Urine: 1.017 (ref 1.005–1.030)
pH: 5 (ref 5.0–8.0)

## 2023-08-28 LAB — CBC
HCT: 38.6 % (ref 36.0–46.0)
Hemoglobin: 13 g/dL (ref 12.0–15.0)
MCH: 33.4 pg (ref 26.0–34.0)
MCHC: 33.7 g/dL (ref 30.0–36.0)
MCV: 99.2 fL (ref 80.0–100.0)
Platelets: 239 10*3/uL (ref 150–400)
RBC: 3.89 MIL/uL (ref 3.87–5.11)
RDW: 12.4 % (ref 11.5–15.5)
WBC: 6.1 10*3/uL (ref 4.0–10.5)
nRBC: 0 % (ref 0.0–0.2)

## 2023-08-28 LAB — COMPREHENSIVE METABOLIC PANEL WITH GFR
ALT: 19 U/L (ref 0–44)
AST: 29 U/L (ref 15–41)
Albumin: 4.2 g/dL (ref 3.5–5.0)
Alkaline Phosphatase: 48 U/L (ref 38–126)
Anion gap: 12 (ref 5–15)
BUN: 27 mg/dL — ABNORMAL HIGH (ref 8–23)
CO2: 24 mmol/L (ref 22–32)
Calcium: 9 mg/dL (ref 8.9–10.3)
Chloride: 96 mmol/L — ABNORMAL LOW (ref 98–111)
Creatinine, Ser: 0.68 mg/dL (ref 0.44–1.00)
GFR, Estimated: >60 mL/min (ref >60)
Glucose, Bld: 108 mg/dL — ABNORMAL HIGH (ref 70–99)
Potassium: 4.3 mmol/L (ref 3.5–5.1)
Sodium: 132 mmol/L — ABNORMAL LOW (ref 135–145)
Total Bilirubin: 0.6 mg/dL (ref 0.0–1.2)
Total Protein: 6.7 g/dL (ref 6.5–8.1)

## 2023-08-28 LAB — TROPONIN I (HIGH SENSITIVITY): Troponin I (High Sensitivity): 4 ng/L (ref <18)

## 2023-08-28 LAB — LIPASE, BLOOD: Lipase: 41 U/L (ref 11–51)

## 2023-08-28 NOTE — ED Provider Triage Note (Signed)
 Emergency Medicine Provider Triage Evaluation Note  [name] , a 83 y.o. female  was evaluated in triage.  Pt complains of dizziness off and on today.  Patient reports that she has been taking her blood pressure more often as she was recently started on a fluid pill with tamsulosin from her urologist.  She feels that she has good urine output.  She reports that her last time she had her blood pressure was June 1 and it was 90/50.  Usually her blood pressure runs 120/70 with her 5 mg of lisinopril.  She reports that whenever she stood up today earlier she felt room spinning sensation but then subsided.  She reports that she had stood up again later in the day to feed of the cat and started to have this symptom again.  She does feel like it is more positional.  Denies any shortness of breath.  Thinks that she may have had some chest pain afterwards but contributes this to some anxiety.  No headache or visual changes.  No trouble walking or talking but did feel mildly off balance during the room spinning sensation that did resolve.  Patient reports that she feels at her baseline now.  Review of Systems  Positive:  Negative:   Physical Exam  BP (!) 162/103 (BP Location: Right Arm)   Pulse (!) 101   Temp 98.3 F (36.8 C)   Resp 17   Ht 5\' 3"  (1.6 m)   Wt 78.9 kg   SpO2 100%   BMI 30.81 kg/m  Gen:   Awake, no distress   Resp:  Normal effort  MSK:   Moves extremities without difficulty  Other:  Cranial nerves grossly intact.  Sensation grossly intact throughout.  No pronator drift.  Strength intact in upper and lower bilateral extremities.  Quick beating left nystagmus on EOMI.  Medical Decision Making  Medically screening exam initiated at 9:02 PM.  Appropriate orders placed.  [name] was informed that the remainder of the evaluation will be completed by another provider, this initial triage assessment does not replace that evaluation, and the importance of remaining in the ED  until their evaluation is complete.  Patient is asymptomatic currently.  I think this is likely more vertiginous/orthostatic hypotension.  Will order CTA of head and neck.  She has no focal neurodeficit and again feels at her baseline, no need for code stroke.  Labs ordered as well.   Spence Dux, [place] 08/28/23 2105

## 2023-08-28 NOTE — ED Triage Notes (Signed)
 The pt is having dizzy spells all day and she has had high bp also

## 2023-08-29 ENCOUNTER — Emergency Department (HOSPITAL_COMMUNITY)

## 2023-08-29 DIAGNOSIS — I6523 Occlusion and stenosis of bilateral carotid arteries: Secondary | ICD-10-CM | POA: Diagnosis not present

## 2023-08-29 DIAGNOSIS — R42 Dizziness and giddiness: Secondary | ICD-10-CM | POA: Diagnosis not present

## 2023-08-29 DIAGNOSIS — I700 Atherosclerosis of aorta: Secondary | ICD-10-CM | POA: Diagnosis not present

## 2023-08-29 LAB — TROPONIN I (HIGH SENSITIVITY): Troponin I (High Sensitivity): 5 ng/L

## 2023-08-29 MED ORDER — IOHEXOL 350 MG/ML SOLN
75.0000 mL | Freq: Once | INTRAVENOUS | Status: AC | PRN
  Administered 2023-08-29: 75 mL via INTRAVENOUS

## 2023-08-29 NOTE — ED Provider Notes (Signed)
 [place] EMERGENCY DEPARTMENT AT [facility] Provider Note   CSN: [id number] Arrival date & time: 08/28/23  1956     History  Chief Complaint  Patient presents with   Hypertension    [name] is a 83 y.o. female.  The history is provided by the patient and a friend.  Patient reports she recently noticed elevated blood pressures had dizziness.  She reports she has been checking her blood pressure more often due to change in medications from her urologist.  She typically takes lisinopril once a day She reports over the past day she has had positional dizziness.  No syncope.  No severe headache or vomiting.  No focal weakness.  No visual changes.  No chest pain or shortness of breath  Patient is now back to baseline   Past Medical History:  Diagnosis Date   Hyperlipidemia    Hypertension     Home Medications Prior to Admission medications   Medication Sig Start Date End Date Taking? Authorizing Provider  Calcium Citrate (CITRACAL PO) Take by mouth.    [provider]  diclofenac  (VOLTAREN ) 75 MG EC tablet Take 1 tablet (75 mg total) by mouth 2 (two) times daily. 03/02/13   Raelyn Bulls, DPM  fish oil-omega-3 fatty acids 1000 MG capsule Take 2 g by mouth daily.    [provider]  Flaxseed, Linseed, (FLAX SEED OIL) 1000 MG CAPS Take by mouth.    [provider]  levothyroxine (SYNTHROID, LEVOTHROID) 125 MCG tablet Take 125 mcg by mouth daily before breakfast.    [provider]  lisinopril (PRINIVIL,ZESTRIL) 5 MG tablet Take 5 mg by mouth daily.    [provider]  lovastatin (MEVACOR) 40 MG tablet Take 40 mg by mouth at bedtime.    [provider]  magnesium 30 MG tablet Take 30 mg by mouth 2 (two) times daily.    [provider]  meloxicam (MOBIC) 15 MG tablet  01/05/13   [provider]  methocarbamol (ROBAXIN) 500 MG tablet Take 500 mg by mouth 4 (four) times daily.    [provider]  Multiple Vitamin (MULTIVITAMIN) tablet Take 1 tablet by mouth daily.    [provider]  pyridOXINE (VITAMIN B-6) 100 MG tablet Take 100 mg by mouth daily.    [provider]  VAGIFEM 10 MCG TABS vaginal tablet  01/10/13   [provider]  Vaginal Moisturizer (VAGISIL FEMININE MOISTURIZER VA) Place vaginally.    [provider]  vitamin B-12 (CYANOCOBALAMIN ) 1000 MCG tablet Take 1,000 mcg by mouth daily.    [provider]      Allergies    Keflex [cephalexin]    Review of Systems   Review of Systems  Eyes:  Negative for visual disturbance.  Neurological:  Positive for dizziness. Negative for headaches.    Physical Exam Updated Vital Signs BP (!) 154/76 (BP Location: Right Arm)   Pulse 93   Temp 98.3 F (36.8 C)   Resp 17   Ht 1.6 m (5\' 3" )   Wt 78.9 kg   SpO2 100%   BMI 30.81 kg/m  Physical Exam CONSTITUTIONAL: Elderly but appears younger than stated age, walking around the ER no distress HEAD: Normocephalic/atraumatic EYES: EOMI/PERRL, no nystagmus ENMT: Mucous membranes moist NECK: supple no meningeal signs CV: S1/S2 noted, no murmurs/rubs/gallops noted LUNGS: Lungs are clear to auscultation bilaterally, no apparent distress NEURO:Awake/alert, face symmetric, no arm or leg drift is noted Gait normal without  ataxia SKIN: warm, color normal PSYCH: no abnormalities of mood noted  ED Results / Procedures / Treatments   Labs (all labs ordered are listed, but only abnormal results are displayed) Labs Reviewed  COMPREHENSIVE METABOLIC PANEL WITH GFR - Abnormal; Notable for the following components:      Result Value   Sodium 132 (*)    Chloride 96 (*)    Glucose, Bld 108 (*)    BUN 27 (*)    All other components within normal limits  URINALYSIS, ROUTINE W REFLEX MICROSCOPIC - Abnormal; Notable for the following components:   APPearance HAZY (*)    Hgb urine dipstick SMALL (*)    Ketones, ur 5 (*)     Leukocytes,Ua SMALL (*)    Bacteria, UA RARE (*)    All other components within normal limits  LIPASE, BLOOD  CBC  TROPONIN I (HIGH SENSITIVITY)  TROPONIN I (HIGH SENSITIVITY)    EKG EKG Interpretation Date/Time:  Friday August 28 2023 20:19:06 EDT Ventricular Rate:  90 PR Interval:  186 QRS Duration:  84 QT Interval:  352 QTC Calculation: 430 R Axis:   182  Text Interpretation: Normal sinus rhythm Possible Left atrial enlargement Right superior axis deviation Low voltage QRS Cannot rule out Anterior infarct , age undetermined Abnormal ECG When compared with ECG of 10-Oct-2008 09:19, PREVIOUS ECG IS PRESENT Confirmed by Ballard Bongo [id number]) on 08/29/2023 2:45:25 AM  Radiology CT ANGIO HEAD NECK W WO CM Result Date: 08/29/2023 CLINICAL DATA:  Central vertigo EXAM: CT ANGIOGRAPHY HEAD AND NECK WITH AND WITHOUT CONTRAST TECHNIQUE: Multidetector CT imaging of the head and neck was performed using the standard protocol during bolus administration of intravenous contrast. Multiplanar CT image reconstructions and MIPs were obtained to evaluate the vascular anatomy. Carotid stenosis measurements (when applicable) are obtained utilizing NASCET criteria, using the distal internal carotid diameter as the denominator. RADIATION DOSE REDUCTION: This exam was performed according to the departmental dose-optimization program which includes automated exposure control, adjustment of the mA and/or kV according to patient size and/or use of iterative reconstruction technique. CONTRAST:  75mL OMNIPAQUE IOHEXOL 350 MG/ML SOLN COMPARISON:  None Available. FINDINGS: CT HEAD FINDINGS Brain: No mass,hemorrhage or extra-axial collection. Normal appearance of the parenchyma and CSF spaces. Vascular: No hyperdense vessel or unexpected vascular calcification. Skull: The visualized skull base, calvarium and extracranial soft tissues are normal. Sinuses/Orbits: No fluid levels or advanced mucosal thickening of the  visualized paranasal sinuses. No mastoid or middle ear effusion. Normal orbits. CTA NECK FINDINGS Skeleton: No acute abnormality or high grade bony spinal canal stenosis. Other neck: Normal pharynx, larynx and major salivary glands. No cervical lymphadenopathy. Unremarkable thyroid  gland. Upper chest: No pneumothorax or pleural effusion. No nodules or masses. Aortic arch: There is calcific atherosclerosis of the aortic arch. Conventional 3 vessel aortic branching pattern. RIGHT carotid system: No dissection, occlusion or aneurysm. Mild atherosclerotic calcification at the carotid bifurcation without hemodynamically significant stenosis. LEFT carotid system: No dissection, occlusion or aneurysm. Mild atherosclerotic calcification at the carotid bifurcation without hemodynamically significant stenosis. Vertebral arteries: Left dominant configuration. There is no dissection, occlusion or flow-limiting stenosis to the skull base (V1-V3 segments). CTA HEAD FINDINGS POSTERIOR CIRCULATION: Vertebral arteries are normal. No proximal occlusion of the anterior or inferior cerebellar arteries. Basilar artery is normal. Superior cerebellar arteries are normal. Posterior cerebral arteries are normal. ANTERIOR CIRCULATION: Atherosclerotic calcification of the internal carotid arteries at the skull base without hemodynamically significant stenosis. Anterior cerebral arteries are normal. Middle cerebral  arteries are normal. Venous sinuses: As permitted by contrast timing, patent. Anatomic variants: None Review of the MIP images confirms the above findings. IMPRESSION: 1. No emergent large vessel occlusion or hemodynamically significant stenosis of the head or neck. 2. Mild bilateral carotid bifurcation atherosclerosis without hemodynamically significant stenosis. Aortic Atherosclerosis (ICD10-I70.0). Electronically Signed   By: Juanetta Nordmann M.D.   On: 08/29/2023 01:36    Procedures Procedures    Medications Ordered in  ED Medications  iohexol (OMNIPAQUE) 350 MG/ML injection 75 mL (75 mLs Intravenous Contrast Given 08/29/23 0107)    ED Course/ Medical Decision Making/ A&P                                 Medical Decision Making  This patient presents to the ED for concern of elevated blood pressure and dizziness, this involves an extensive number of treatment options, and is a complaint that carries with it a high risk of complications and morbidity.  The differential diagnosis includes but is not limited to CVA, intracranial hemorrhage, acute coronary syndrome, renal failure, urinary tract infection, electrolyte disturbance, pneumonia    Comorbidities that complicate the patient evaluation: Patient's presentation is complicated by their history of hypertension  Social Determinants of Health: Patient lives alone which increases the complexity of managing their presentation  Additional history obtained: Additional history obtained from neighbor  Lab Tests: I Ordered, and personally interpreted labs.  The pertinent results include: Labs overall unremarkable  Imaging Studies ordered: I ordered imaging studies including CT scan head and neck  I independently visualized and interpreted imaging which showed no emergent findings I agree with the radiologist interpretation  Test Considered: MRI brain considered for occult cause of dizziness, but since patient is back to baseline will defer   Reevaluation: After the interventions noted above, I reevaluated the patient and found that they have :improved  Complexity of problems addressed: Patient's presentation is most consistent with  acute presentation with potential threat to life or bodily function  Disposition: After consideration of the diagnostic results and the patient's response to treatment,  I feel that the patent would benefit from discharge  .    By the time of my evaluation patient was back to baseline.  She was walking around smiling  in no distress.  Workup overall unremarkable. She would like to be discharged.  She will increase her lisinopril to 5 mg twice daily, check BP twice a day and call her PCP in 1 week        Final Clinical Impression(s) / ED Diagnoses Final diagnoses:  Primary hypertension  Dizziness    Rx / DC Orders ED Discharge Orders     None         Eldon Greenland, MD 08/29/23 [phone]

## 2023-09-02 DIAGNOSIS — M48061 Spinal stenosis, lumbar region without neurogenic claudication: Secondary | ICD-10-CM | POA: Diagnosis not present

## 2023-09-02 DIAGNOSIS — M4316 Spondylolisthesis, lumbar region: Secondary | ICD-10-CM | POA: Diagnosis not present

## 2023-09-02 DIAGNOSIS — M4805 Spinal stenosis, thoracolumbar region: Secondary | ICD-10-CM | POA: Diagnosis not present

## 2023-09-07 DIAGNOSIS — M791 Myalgia, unspecified site: Secondary | ICD-10-CM | POA: Diagnosis not present

## 2023-09-07 DIAGNOSIS — M4316 Spondylolisthesis, lumbar region: Secondary | ICD-10-CM | POA: Diagnosis not present

## 2023-09-07 DIAGNOSIS — M4727 Other spondylosis with radiculopathy, lumbosacral region: Secondary | ICD-10-CM | POA: Diagnosis not present

## 2023-09-07 DIAGNOSIS — G8929 Other chronic pain: Secondary | ICD-10-CM | POA: Diagnosis not present

## 2023-09-08 DIAGNOSIS — R351 Nocturia: Secondary | ICD-10-CM | POA: Diagnosis not present

## 2023-09-08 DIAGNOSIS — I10 Essential (primary) hypertension: Secondary | ICD-10-CM | POA: Diagnosis not present

## 2023-09-08 DIAGNOSIS — I6529 Occlusion and stenosis of unspecified carotid artery: Secondary | ICD-10-CM | POA: Diagnosis not present

## 2023-09-10 DIAGNOSIS — Z66 Do not resuscitate: Secondary | ICD-10-CM | POA: Diagnosis not present

## 2023-09-10 DIAGNOSIS — I3481 Nonrheumatic mitral (valve) annulus calcification: Secondary | ICD-10-CM | POA: Diagnosis not present

## 2023-09-10 DIAGNOSIS — Z87891 Personal history of nicotine dependence: Secondary | ICD-10-CM | POA: Diagnosis not present

## 2023-09-10 DIAGNOSIS — E871 Hypo-osmolality and hyponatremia: Secondary | ICD-10-CM | POA: Diagnosis not present

## 2023-09-10 DIAGNOSIS — E7849 Other hyperlipidemia: Secondary | ICD-10-CM | POA: Diagnosis not present

## 2023-09-10 DIAGNOSIS — M353 Polymyalgia rheumatica: Secondary | ICD-10-CM | POA: Diagnosis not present

## 2023-09-10 DIAGNOSIS — F419 Anxiety disorder, unspecified: Secondary | ICD-10-CM | POA: Diagnosis not present

## 2023-09-10 DIAGNOSIS — R531 Weakness: Secondary | ICD-10-CM | POA: Diagnosis not present

## 2023-09-10 DIAGNOSIS — I10 Essential (primary) hypertension: Secondary | ICD-10-CM | POA: Diagnosis not present

## 2023-09-10 DIAGNOSIS — I160 Hypertensive urgency: Secondary | ICD-10-CM | POA: Diagnosis not present

## 2023-09-10 DIAGNOSIS — I358 Other nonrheumatic aortic valve disorders: Secondary | ICD-10-CM | POA: Diagnosis not present

## 2023-09-10 DIAGNOSIS — R351 Nocturia: Secondary | ICD-10-CM | POA: Diagnosis not present

## 2023-09-10 DIAGNOSIS — I5189 Other ill-defined heart diseases: Secondary | ICD-10-CM | POA: Diagnosis not present

## 2023-09-10 DIAGNOSIS — R079 Chest pain, unspecified: Secondary | ICD-10-CM | POA: Diagnosis not present

## 2023-09-10 DIAGNOSIS — I214 Non-ST elevation (NSTEMI) myocardial infarction: Secondary | ICD-10-CM | POA: Diagnosis not present

## 2023-09-10 DIAGNOSIS — E039 Hypothyroidism, unspecified: Secondary | ICD-10-CM | POA: Diagnosis not present

## 2023-09-10 DIAGNOSIS — I2510 Atherosclerotic heart disease of native coronary artery without angina pectoris: Secondary | ICD-10-CM | POA: Diagnosis not present

## 2023-09-10 DIAGNOSIS — I340 Nonrheumatic mitral (valve) insufficiency: Secondary | ICD-10-CM | POA: Diagnosis not present

## 2023-09-10 DIAGNOSIS — R42 Dizziness and giddiness: Secondary | ICD-10-CM | POA: Diagnosis not present

## 2023-09-10 DIAGNOSIS — I2489 Other forms of acute ischemic heart disease: Secondary | ICD-10-CM | POA: Diagnosis not present

## 2023-09-10 DIAGNOSIS — R7989 Other specified abnormal findings of blood chemistry: Secondary | ICD-10-CM | POA: Diagnosis not present

## 2023-09-10 DIAGNOSIS — I444 Left anterior fascicular block: Secondary | ICD-10-CM | POA: Diagnosis not present

## 2023-09-10 DIAGNOSIS — I6529 Occlusion and stenosis of unspecified carotid artery: Secondary | ICD-10-CM | POA: Diagnosis not present

## 2023-09-10 DIAGNOSIS — Z79890 Hormone replacement therapy (postmenopausal): Secondary | ICD-10-CM | POA: Diagnosis not present

## 2023-09-10 DIAGNOSIS — Z79899 Other long term (current) drug therapy: Secondary | ICD-10-CM | POA: Diagnosis not present

## 2023-09-10 DIAGNOSIS — R9431 Abnormal electrocardiogram [ECG] [EKG]: Secondary | ICD-10-CM | POA: Diagnosis not present

## 2023-09-11 DIAGNOSIS — I340 Nonrheumatic mitral (valve) insufficiency: Secondary | ICD-10-CM | POA: Diagnosis not present

## 2023-09-11 DIAGNOSIS — R7989 Other specified abnormal findings of blood chemistry: Secondary | ICD-10-CM | POA: Diagnosis not present

## 2023-09-11 DIAGNOSIS — I214 Non-ST elevation (NSTEMI) myocardial infarction: Secondary | ICD-10-CM | POA: Diagnosis not present

## 2023-09-11 DIAGNOSIS — R42 Dizziness and giddiness: Secondary | ICD-10-CM | POA: Diagnosis not present

## 2023-09-11 DIAGNOSIS — I358 Other nonrheumatic aortic valve disorders: Secondary | ICD-10-CM | POA: Diagnosis not present

## 2023-09-11 DIAGNOSIS — I3481 Nonrheumatic mitral (valve) annulus calcification: Secondary | ICD-10-CM | POA: Diagnosis not present

## 2023-09-11 DIAGNOSIS — I6529 Occlusion and stenosis of unspecified carotid artery: Secondary | ICD-10-CM | POA: Diagnosis not present

## 2023-09-11 DIAGNOSIS — I10 Essential (primary) hypertension: Secondary | ICD-10-CM | POA: Diagnosis not present

## 2023-09-11 DIAGNOSIS — I5189 Other ill-defined heart diseases: Secondary | ICD-10-CM | POA: Diagnosis not present

## 2023-09-12 DIAGNOSIS — I214 Non-ST elevation (NSTEMI) myocardial infarction: Secondary | ICD-10-CM | POA: Diagnosis not present

## 2023-09-18 DIAGNOSIS — E871 Hypo-osmolality and hyponatremia: Secondary | ICD-10-CM | POA: Diagnosis not present

## 2023-09-18 DIAGNOSIS — R351 Nocturia: Secondary | ICD-10-CM | POA: Diagnosis not present

## 2023-09-18 DIAGNOSIS — I6529 Occlusion and stenosis of unspecified carotid artery: Secondary | ICD-10-CM | POA: Diagnosis not present

## 2023-09-18 DIAGNOSIS — I10 Essential (primary) hypertension: Secondary | ICD-10-CM | POA: Diagnosis not present

## 2023-09-27 DIAGNOSIS — S61412A Laceration without foreign body of left hand, initial encounter: Secondary | ICD-10-CM | POA: Diagnosis not present

## 2023-09-27 DIAGNOSIS — L0889 Other specified local infections of the skin and subcutaneous tissue: Secondary | ICD-10-CM | POA: Diagnosis not present

## 2023-10-26 DIAGNOSIS — M5416 Radiculopathy, lumbar region: Secondary | ICD-10-CM | POA: Diagnosis not present

## 2023-10-26 DIAGNOSIS — M48061 Spinal stenosis, lumbar region without neurogenic claudication: Secondary | ICD-10-CM | POA: Diagnosis not present

## 2023-11-05 DIAGNOSIS — K080 Exfoliation of teeth due to systemic causes: Secondary | ICD-10-CM | POA: Diagnosis not present

## 2023-11-09 DIAGNOSIS — W5501XA Bitten by cat, initial encounter: Secondary | ICD-10-CM | POA: Diagnosis not present

## 2023-11-09 DIAGNOSIS — S41131A Puncture wound without foreign body of right upper arm, initial encounter: Secondary | ICD-10-CM | POA: Diagnosis not present

## 2023-11-18 DIAGNOSIS — M48062 Spinal stenosis, lumbar region with neurogenic claudication: Secondary | ICD-10-CM | POA: Diagnosis not present

## 2023-11-18 DIAGNOSIS — M5442 Lumbago with sciatica, left side: Secondary | ICD-10-CM | POA: Diagnosis not present

## 2023-11-18 DIAGNOSIS — G894 Chronic pain syndrome: Secondary | ICD-10-CM | POA: Diagnosis not present

## 2023-11-18 DIAGNOSIS — M4316 Spondylolisthesis, lumbar region: Secondary | ICD-10-CM | POA: Diagnosis not present

## 2023-11-26 DIAGNOSIS — M353 Polymyalgia rheumatica: Secondary | ICD-10-CM | POA: Diagnosis not present

## 2023-11-26 DIAGNOSIS — E871 Hypo-osmolality and hyponatremia: Secondary | ICD-10-CM | POA: Diagnosis not present

## 2023-11-26 DIAGNOSIS — E039 Hypothyroidism, unspecified: Secondary | ICD-10-CM | POA: Diagnosis not present

## 2023-11-26 DIAGNOSIS — Z131 Encounter for screening for diabetes mellitus: Secondary | ICD-10-CM | POA: Diagnosis not present

## 2023-11-26 DIAGNOSIS — Z0000 Encounter for general adult medical examination without abnormal findings: Secondary | ICD-10-CM | POA: Diagnosis not present

## 2023-11-26 DIAGNOSIS — Z1331 Encounter for screening for depression: Secondary | ICD-10-CM | POA: Diagnosis not present

## 2023-11-26 DIAGNOSIS — I10 Essential (primary) hypertension: Secondary | ICD-10-CM | POA: Diagnosis not present

## 2023-11-26 DIAGNOSIS — E7800 Pure hypercholesterolemia, unspecified: Secondary | ICD-10-CM | POA: Diagnosis not present

## 2023-11-26 DIAGNOSIS — M8580 Other specified disorders of bone density and structure, unspecified site: Secondary | ICD-10-CM | POA: Diagnosis not present

## 2023-11-27 DIAGNOSIS — Z713 Dietary counseling and surveillance: Secondary | ICD-10-CM | POA: Diagnosis not present

## 2023-11-27 DIAGNOSIS — F419 Anxiety disorder, unspecified: Secondary | ICD-10-CM | POA: Diagnosis not present

## 2023-11-27 DIAGNOSIS — I10 Essential (primary) hypertension: Secondary | ICD-10-CM | POA: Diagnosis not present

## 2023-11-27 DIAGNOSIS — E039 Hypothyroidism, unspecified: Secondary | ICD-10-CM | POA: Diagnosis not present

## 2023-12-01 DIAGNOSIS — I779 Disorder of arteries and arterioles, unspecified: Secondary | ICD-10-CM | POA: Diagnosis not present

## 2023-12-01 DIAGNOSIS — E871 Hypo-osmolality and hyponatremia: Secondary | ICD-10-CM | POA: Diagnosis not present

## 2023-12-01 DIAGNOSIS — I6523 Occlusion and stenosis of bilateral carotid arteries: Secondary | ICD-10-CM | POA: Diagnosis not present

## 2023-12-03 DIAGNOSIS — M1712 Unilateral primary osteoarthritis, left knee: Secondary | ICD-10-CM | POA: Diagnosis not present

## 2023-12-10 DIAGNOSIS — K080 Exfoliation of teeth due to systemic causes: Secondary | ICD-10-CM | POA: Diagnosis not present

## 2023-12-15 DIAGNOSIS — I10 Essential (primary) hypertension: Secondary | ICD-10-CM | POA: Diagnosis not present

## 2023-12-15 DIAGNOSIS — E871 Hypo-osmolality and hyponatremia: Secondary | ICD-10-CM | POA: Diagnosis not present

## 2024-01-07 DIAGNOSIS — M170 Bilateral primary osteoarthritis of knee: Secondary | ICD-10-CM | POA: Diagnosis not present

## 2024-01-08 DIAGNOSIS — Z23 Encounter for immunization: Secondary | ICD-10-CM | POA: Diagnosis not present

## 2024-01-08 DIAGNOSIS — E039 Hypothyroidism, unspecified: Secondary | ICD-10-CM | POA: Diagnosis not present

## 2024-01-08 DIAGNOSIS — M2550 Pain in unspecified joint: Secondary | ICD-10-CM | POA: Diagnosis not present

## 2024-01-14 DIAGNOSIS — M170 Bilateral primary osteoarthritis of knee: Secondary | ICD-10-CM | POA: Diagnosis not present

## 2024-01-19 DIAGNOSIS — Z01419 Encounter for gynecological examination (general) (routine) without abnormal findings: Secondary | ICD-10-CM | POA: Diagnosis not present

## 2024-01-19 DIAGNOSIS — M816 Localized osteoporosis [Lequesne]: Secondary | ICD-10-CM | POA: Diagnosis not present

## 2024-01-19 DIAGNOSIS — Z1231 Encounter for screening mammogram for malignant neoplasm of breast: Secondary | ICD-10-CM | POA: Diagnosis not present

## 2024-01-19 DIAGNOSIS — Z6827 Body mass index (BMI) 27.0-27.9, adult: Secondary | ICD-10-CM | POA: Diagnosis not present

## 2024-01-21 DIAGNOSIS — M170 Bilateral primary osteoarthritis of knee: Secondary | ICD-10-CM | POA: Diagnosis not present

## 2024-02-05 ENCOUNTER — Telehealth: Payer: Self-pay | Admitting: Pharmacist

## 2024-02-05 NOTE — Progress Notes (Signed)
 02/05/2024 Name: [name] MRN: [id number] DOB: 1941/01/14  Chief Complaint  Patient presents with   Hypertension    [name] is a 83 y.o. year old female who presented for a telephone visit.   They were referred to the pharmacist by their PCP for assistance in managing hypertension.    Subjective:  Care Team: Primary Care Provider: Loreli Kins, MD ; Next Scheduled Visit: 03/28/24 Clinical Pharmacist: Aloysius Lewis, PharmD  Medication Access/Adherence  Current Pharmacy:  [facility] - [place], [place] - [address] [place] [address] Phone: [phone] Fax: [phone]   Patient reports affordability concerns with their medications: No  Patient reports access/transportation concerns to their pharmacy: No  Patient reports adherence concerns with their medications:  No     Hypertension:  Current medications: Metoprolol tartrate 25mg  twice a day, Lisinopril 10mg - 1 tab in AM + 1.5 tab in PM Medications previously tried: Hydralazine (tachycardia/panic attacks), hydrochlorothiazide (wasn't taking- low sodium concerns), Spiro (no issues), amlodipine (edema)  6/19 hospital admission: Furosemide + hydrochlorothiazide stopped due to hypotension + low Na  Med history: Thiazide or diuretic: Low sodium CCB: Edema with amlodipine, already on beta-blocker Beta-blocker: Taking, but cannot increase due to bradycardia ACE/ARB: Taking MRA: Can consider Hydralazine: Tried, but side effects Clonidine: Could consider Alpha-blocker: Could consider   Patient has a validated, automated, upper arm home BP cuff Current blood pressure readings readings: 150-155 systolic/ 75-100 diastolic average  10/22 to 11/14:  8AM: 157/93, 147/88, 158/95 Noon: 150/83, 183/100, 160/83 4-5PM: 158/83, 135/74, 136/71, 176/88, 127/70, 152/84 7PM: 153/78 HR: 67, 68, 74, 77, 65, 72, 64, 69, 62, 78, 62, 68, 70, 60  **Other causes reviewed:    Sleep:  Current medications: Temazepam- helps her sleep for about 5 hours - Could use minor improvement; will occasionally wake up throughout the night at 2-3PM - No concerns if awake at 5 or 6AM  Anxiety:  Current medications: Buspirone 20mg  in AM, 10mg  at 2PM, and 10mg  at [facility] - Believes this is doing well  Chronic pain (spinal stenosis):  Current medications: Tylenol 2 in AM + 2 in PM, Celebrex 200mg  twice a day, Tramadol or Tylenol #3 (very constipating) on rare occurrence if needed - Believes this is doing well - Severe low BP if taking Tramadol around same timing of hydroxyzine  Objective:  No results found for: HGBA1C  Lab Results  Component Value Date   CREATININE 0.68 08/28/2023   BUN 27 (H) 08/28/2023   NA 132 (L) 08/28/2023   K 4.3 08/28/2023   CL 96 (L) 08/28/2023   CO2 24 08/28/2023    No results found for: CHOL, HDL, LDLCALC, LDLDIRECT, TRIG, CHOLHDL  Medications Reviewed Today   Medications were not reviewed in this encounter       Assessment/Plan:   Hypertension: - Currently uncontrolled - Reviewed long term cardiovascular and renal outcomes of uncontrolled blood pressure - Reviewed appropriate blood pressure monitoring technique and reviewed goal blood pressure. - Recommended to check home blood pressure and heart rate 1-2 hours after morning BP medications    Follow Up Plan:  - Follow-up call on ... - INCREASE Lisinopril to 15mg  twice a day (had hypotension with prior increase)- verify with Dr. Loreli - Other options: Re-add Spironolactone or Doxazosin; switching Lopressor to Coreg - Patient to send me BP readings to my email - Will check BP's 1-2 hours after morning dose of BP med (concerns about hypotension prior) - Ask about doing Hydroxyzine  at bedtime on rare occasion if unable to sleep at like 2-3AM after Temazepam  **Will decide if needing to keep 04/14/24 Hypertension clinic appt or not  Aloysius Lewis, PharmD,  [facility] Phone Number: [phone]

## 2024-02-12 ENCOUNTER — Telehealth: Payer: Self-pay | Admitting: Pharmacist

## 2024-02-12 NOTE — Progress Notes (Signed)
   02/12/2024  Patient ID: [name], female   DOB: [birth year], 83 y.o.   MRN: [id number]  Called patient for BP follow-up call. BP readings since our 11/14 call when Lisinopril dose was increased.   11/14 to 11/21: 11AM: 144/83, 157/87, 129/74, 145/79, 160/87, 135/77 7PM: 123/67, 138/74, 128/69, 129/73, 116/65  HR lower in the mornings at 10AM: 47, 52, 51  She also said that she has developed an itch since increasing the Lisinopril dose  BP readings average on 11/14 was 150/84 BP readings average on 11/21 was 137/76 (from increasing Lisinopril by 5mg  in the morning; 15mg  twice a day)  However this will NOT work long-term due to the itch and BP dropping too low in the mornings  Gave option to Dr. Loreli to either add Spironolactone or switch Metoprolol tartrate to Carvedilol. Would prefer beta-blocker switch due to wanting to avoid adding additional medications if possible.    Dose change recommended is Metoprolol tartrate 25mg  BID to Carvedilol 6.25mg  BID. Due to her sensitivity to medications, would do Carvedilol 3.125mg  BID for 1 week and increase further if tolerated.   Called and discussed with the patient. She agreed to changes. Aware she can email me with any BP concerns with the Carvedilol over the weekend or holiday if needed. Switching Lisinopril back to the original dosing. Advised she can take a cetirizine daily if needed to help with the itching for now. Confirmed understanding of the changes listed. Will switch the medication over tomorrow and hold onto the Metoprolol just in case the Carvedilol does not work out.   Follow-up on 12/1 to see how BP's look and patient will email me readings in the morning prior.    Aloysius Lewis, PharmD, [facility] [place] & [facility] Phone Number: [phone]

## 2024-02-15 ENCOUNTER — Telehealth: Payer: Self-pay | Admitting: Pharmacist

## 2024-02-15 DIAGNOSIS — H43813 Vitreous degeneration, bilateral: Secondary | ICD-10-CM | POA: Diagnosis not present

## 2024-02-15 DIAGNOSIS — H25813 Combined forms of age-related cataract, bilateral: Secondary | ICD-10-CM | POA: Diagnosis not present

## 2024-02-15 DIAGNOSIS — H04123 Dry eye syndrome of bilateral lacrimal glands: Secondary | ICD-10-CM | POA: Diagnosis not present

## 2024-02-15 DIAGNOSIS — D3132 Benign neoplasm of left choroid: Secondary | ICD-10-CM | POA: Diagnosis not present

## 2024-02-15 NOTE — Progress Notes (Signed)
   02/15/2024  Patient ID: [name], female   DOB: 1940/06/20, 83 y.o.   MRN: [id number]  Received a phone call from the patient this morning. She reports the Carvedilol switch has made her very sleepy with the change. Only taking the 3.125mg  currently. BP was 135/78 last night and 106/78 this morning. HR of 67 and 68.   Due to this change, she would like to try it once daily in the evening due to how sleepy it makes her in the daytime. Advised we will see how this change works for her.   Started this on Saturday. Will send me her readings on Wednesday prior to the holiday to review and call if any concerns. We will discuss again on Monday.   Aloysius Lewis, PharmD, [facility] Phone Number: [phone]

## 2024-02-17 NOTE — Progress Notes (Addendum)
 Average BP readings since the daily Carvedilol 3.125mg  change on 11/24 to 11/26:  AM: 135/78 PM: 123/74  Appears to be slowly improving too. Will discuss further on Monday.

## 2024-02-22 ENCOUNTER — Telehealth: Payer: Self-pay | Admitting: Pharmacist

## 2024-02-22 NOTE — Progress Notes (Addendum)
   02/22/2024  Patient ID: [name], female   DOB: 1940/04/01, 83 y.o.   MRN: [id number]  Called and spoke with the patient on the phone today. She reports doing well with the medications at this time. Had some mild urinary hesitancy when starting the Carvedilol, but has resolved over time. BP is higher in the mornings, so she suggested switching the Lisinopril to 15mg  in the morning instead and 10mg  at night. Agreed to this change.  May be sending her BP readings from last Wednesday to my email. BP readings may be slightly higher due to muscle spasms from a lot of activity over the holiday.  Follow-up on on Monday 12/8 at 10:30AM for more BP readings with Lisinopril switch.   BP readings from 11/27 to 12/1: AM: 113/76, 167/100, 153/79, 131/73, 120/67 (average 137/79) PM: 149/82, 141/80, 160/89, 152/87 (average 151/85)   Aloysius Lewis, PharmD, [facility] [place] [facility] Phone Number: [phone]

## 2024-02-25 DIAGNOSIS — Z8739 Personal history of other diseases of the musculoskeletal system and connective tissue: Secondary | ICD-10-CM | POA: Diagnosis not present

## 2024-02-25 DIAGNOSIS — M2550 Pain in unspecified joint: Secondary | ICD-10-CM | POA: Diagnosis not present

## 2024-02-25 DIAGNOSIS — M1990 Unspecified osteoarthritis, unspecified site: Secondary | ICD-10-CM | POA: Diagnosis not present

## 2024-02-25 DIAGNOSIS — M79643 Pain in unspecified hand: Secondary | ICD-10-CM | POA: Diagnosis not present

## 2024-02-29 ENCOUNTER — Telehealth: Payer: Self-pay | Admitting: Pharmacist

## 2024-02-29 NOTE — Progress Notes (Signed)
   02/29/2024  Patient ID: [name], female   DOB: 02/19/1941, 83 y.o.   MRN: [id number]  Called and spoke with the patient on the phone this morning for a BP check-in. Reports since switching to carvedilol she is having issues with urinary retention/urinating, scalp itching (cetirizine helps only a little). Due to this, will switch back to Lopressor for a week to see if side effects go away. Follow-up on 12/15 at 11AM for a call to assess BP further.   Current medications: Metoprolol tartrate 25mg  twice a day (will be restarting today), Lisinopril 10mg - 1 tab in AM + 1.5 tab in PM Medications previously tried: Hydralazine (tachycardia/panic attacks), hydrochlorothiazide (wasn't taking- low sodium concerns), Spiro (no issues), amlodipine (edema), carvedilol (itching scalp/urinary retention/OA pain- was only on 3.125mg  daily), lisinopril dose increase by 5mg  (systolic 100's)   6/19 hospital admission: Furosemide + hydrochlorothiazide stopped due to hypotension + low Na   Med history: Thiazide or diuretic: Low sodium CCB: Edema with amlodipine, already on beta-blocker Beta-blocker: Taking, but cannot increase due to bradycardia ACE/ARB: Taking MRA: Can consider Hydralazine: Tried, but side effects Clonidine: Could consider Alpha-blocker: Could consider  BP from 12/2 to 12/8 (switched to Lisinopril 15mg  in AM/ 10mg  in PM + Carvedilol 3.125mg  in PM): AM: 159/83, 137/87, 159/86, 122/77, 129/69, 132/77, 144/90 (average 140/80) PM: 126/69, 116/87, 132/78, 124/64, 125/68 (average 125/73) HR average 65  BP readings from 11/27 to 12/1 (Lisinopril 10mg  in AM/ 15mg  in PM + Carvedilol 3.125mg  in PM): AM: 113/76, 167/100, 153/79, 131/73, 120/67 (average 137/79) PM: 149/82, 141/80, 160/89, 152/87 (average 151/85)  BP readings average on 11/14 to 11/21 (increased Lisinopril by 5mg  in AM= 15mg  twice a day): AM average 137/76  **Baseline** on 10/22 to 11/14 (Lopressor 25mg  twice a day +  Lisinopril 10mg  in AM/ 15mg  in PM):  8AM: 157/93, 147/88, 158/95 Noon: 150/83, 183/100, 160/83 (AM average 160/90) 4-5PM: 158/83, 135/74, 136/71, 176/88, 127/70, 152/84 7PM: 153/78 (PM average 148/66) HR: 67, 68, 74, 77, 65, 72, 64, 69, 62, 78, 62, 68, 70, 60 (average 68)   Aloysius Lewis, PharmD, [facility] [place] [facility] [place] [facility] Phone Number: [phone]

## 2024-03-07 ENCOUNTER — Telehealth: Payer: Self-pay | Admitting: Pharmacist

## 2024-03-07 NOTE — Progress Notes (Signed)
° °  03/07/2024  Patient ID: [name], female   DOB: 04-16-1940, 83 y.o.   MRN: [id number]  Called and spoke with the patient on the phone this morning for a BP check-in. Reports she has felt so much better since switching back to the Metoprolol from Carvedilol. She didn't realize how fatigued she was on it.   Current medications: Metoprolol tartrate 25mg  twice a day, Lisinopril 10mg - 1 tab in AM + 1.5 tab in PM Medications previously tried: Hydralazine (tachycardia/panic attacks), hydrochlorothiazide (wasn't taking- low sodium concerns), Spiro (no issues), amlodipine (edema), carvedilol (itching scalp/urinary retention/OA pain- was only on 3.125mg  daily), lisinopril dose increase by 5mg  (systolic 100's)   6/19 hospital admission: Furosemide + hydrochlorothiazide stopped due to hypotension + low Na   Med history: Thiazide or diuretic: Low sodium CCB: Edema with amlodipine, already on beta-blocker Beta-blocker: Taking, but cannot increase due to bradycardia; Carvedilol too much fatigue ACE/ARB: Taking MRA: Can consider Hydralazine: Tried, but side effects Clonidine: Could consider Alpha-blocker: Could consider  BP from 12/9 to 12/15 (back to original meds): AM: 118/70, 129/71, 141/80, 144/82, 149/80, 123/76 (average 134/77) PM: 112/66, 142/73, 142/81, 155/84, 123/71 (average 135/75) Average HR 58  BP from 12/2 to 12/8 (switched to Lisinopril 15mg  in AM/ 10mg  in PM + Carvedilol 3.125mg  in PM): AM: 159/83, 137/87, 159/86, 122/77, 129/69, 132/77, 144/90 (average 140/80) PM: 126/69, 116/87, 132/78, 124/64, 125/68 (average 125/73) HR average 65  BP readings from 11/27 to 12/1 (Lisinopril 10mg  in AM/ 15mg  in PM + Carvedilol 3.125mg  in PM): AM: 113/76, 167/100, 153/79, 131/73, 120/67 (average 137/79) PM: 149/82, 141/80, 160/89, 152/87 (average 151/85)  BP readings average on 11/14 to 11/21 (increased Lisinopril by 5mg  in AM= 15mg  twice a day): AM average 137/76  **Baseline**  on 10/22 to 11/14 (Lopressor 25mg  twice a day + Lisinopril 10mg  in AM/ 15mg  in PM):  8AM: 157/93, 147/88, 158/95 Noon: 150/83, 183/100, 160/83 (AM average 160/90) 4-5PM: 158/83, 135/74, 136/71, 176/88, 127/70, 152/84 7PM: 153/78 (PM average 148/66) HR: 67, 68, 74, 77, 65, 72, 64, 69, 62, 78, 62, 68, 70, 60 (average 68)  Plan: - No changes for today. Will send message to Dr. Loreli for review and see how readings look again on 12/22 at 10:30AM    Aloysius Lewis, PharmD, [facility] Phone Number: [phone]

## 2024-03-14 ENCOUNTER — Telehealth: Payer: Self-pay | Admitting: Pharmacist

## 2024-03-14 NOTE — Progress Notes (Signed)
" ° °  03/14/2024  Patient ID: [name], female   DOB: 1941-01-25, 83 y.o.   MRN: [id number]  Called and spoke with the patient on the phone this morning for a BP check-in. Reports doing well and will be leaving for a week for the holidays.   Current medications: Metoprolol tartrate 25mg  twice a day, Lisinopril 10mg - 1 tab in AM + 1.5 tab in PM Medications previously tried: Hydralazine (tachycardia/panic attacks), hydrochlorothiazide (wasn't taking- low sodium concerns), Spiro (no issues), amlodipine (edema), carvedilol (itching scalp/urinary retention/OA pain- was only on 3.125mg  daily), lisinopril dose increase by 5mg  (systolic 100's)   6/19 hospital admission: Furosemide + hydrochlorothiazide stopped due to hypotension + low Na   Med history: Thiazide or diuretic: Low sodium CCB: Edema with amlodipine, already on beta-blocker Beta-blocker: Taking, but cannot increase due to bradycardia; Carvedilol too much fatigue ACE/ARB: Taking MRA: Can consider Hydralazine: Tried, but side effects Clonidine: Could consider Alpha-blocker: Could consider  BP from 12/16 to 12/22 (original meds): 10:30AM: 138/76, 127/77, 104/62, 135/79, 130/78, 137/85, 132/80 (avg 129/77) 7:30PM: 124/76, 121/67, 125/74, 136/78, 125/66, 132/71, 120/68 (avg 126/71) Average HR 57  BP from 12/9 to 12/15 (back to original meds): AM: 118/70, 129/71, 141/80, 144/82, 149/80, 123/76 (average 134/77) PM: 112/66, 142/73, 142/81, 155/84, 123/71 (average 135/75) Average HR 58  BP from 12/2 to 12/8 (switched to Lisinopril 15mg  in AM/ 10mg  in PM + Carvedilol 3.125mg  in PM): AM: 159/83, 137/87, 159/86, 122/77, 129/69, 132/77, 144/90 (average 140/80) PM: 126/69, 116/87, 132/78, 124/64, 125/68 (average 125/73) Average HR 65  BP readings from 11/27 to 12/1 (Lisinopril 10mg  in AM/ 15mg  in PM + Carvedilol 3.125mg  in PM): AM: 113/76, 167/100, 153/79, 131/73, 120/67 (average 137/79) PM: 149/82, 141/80, 160/89, 152/87 (average  151/85)  BP readings average on 11/14 to 11/21 (increased Lisinopril by 5mg  in AM= 15mg  twice a day): AM average 137/76  **Baseline** on 10/22 to 11/14 (Lopressor 25mg  twice a day + Lisinopril 10mg  in AM/ 15mg  in PM):  8AM: 157/93, 147/88, 158/95 Noon: 150/83, 183/100, 160/83 (AM average 160/90) 4-5PM: 158/83, 135/74, 136/71, 176/88, 127/70, 152/84 7PM: 153/78 (PM average 148/66) HR: 67, 68, 74, 77, 65, 72, 64, 69, 62, 78, 62, 68, 70, 60 (average 68)  Plan: - No changes for today- last check-in. Will have patient follow-up with Dr. Loreli on 03/28/24 and Cardiology HTN clinic on 1/22 in-case she needs them for future reference.  - Patient aware she can reach out for additional needs in the future if needed   Aloysius Lewis, PharmD, [facility] Phone Number: [phone]  "

## 2024-04-06 ENCOUNTER — Telehealth: Payer: Self-pay | Admitting: Pharmacist

## 2024-04-06 NOTE — Progress Notes (Signed)
" ° °  04/06/2024  Patient ID: [name], female   DOB: 1940-05-10, 84 y.o.   MRN: [id number]  Received email from the patient today.  Hello [name],  I was trying to wait until the 22nd when I go to the Hypertension Clinic to discuss this matter, however my generalized itching seems to be getting worse the longer I take the Lisinopril. It started out only mild to moderate, but now persistent and more severe.  I can alleviate most of it by taking the non drowsy Zyrtec 5mg  daily. Also to note, my non fasting 3 hr post prandial glucose done on 1/5 was 62. I am a candidate for [facility]. I will have a fasting baseline CMP done on Thursday morning. I do not want to start it until I resolve this itching problem. I would appreciate your opinion, especially in regards to the itching. Thank you very much, [name]  After discussion with Dr. Loreli, recommendations are listed below. Full discussion within eCW if needed.  Final response to patient: Good morning,  I just discussed the following with Dr. Loreli. Our recommendation is to switch the dosing timeframe back to Lisinopril 10mg  in AM + 15mg  in PM, or decrease to 10mg  twice a day. I would probably start with switching the timeframes first and if that does not help, you can decrease the dose to 10mg  twice a day by the end of the week.  We would also suggest adding some famotidine/Pepcid over-the-counter for histamine concerns with your non-drowsy Zyrtec. Would highly suggest discussing with HTN clinic on 1/22 about switching Lisinopril for something else. We wouldn't want to switch it with their visit coming up so soon.   Please let me know if you have any further questions!  Best,     [name] "

## 2024-04-13 ENCOUNTER — Encounter (HOSPITAL_BASED_OUTPATIENT_CLINIC_OR_DEPARTMENT_OTHER): Payer: Self-pay

## 2024-04-14 ENCOUNTER — Ambulatory Visit (HOSPITAL_BASED_OUTPATIENT_CLINIC_OR_DEPARTMENT_OTHER): Admitting: Family

## 2024-04-14 ENCOUNTER — Encounter (HOSPITAL_BASED_OUTPATIENT_CLINIC_OR_DEPARTMENT_OTHER): Payer: Self-pay | Admitting: Family

## 2024-04-14 VITALS — BP 150/80 | HR 73 | Ht 61.0 in | Wt 156.0 lb

## 2024-04-14 DIAGNOSIS — I2510 Atherosclerotic heart disease of native coronary artery without angina pectoris: Secondary | ICD-10-CM | POA: Diagnosis not present

## 2024-04-14 DIAGNOSIS — E871 Hypo-osmolality and hyponatremia: Secondary | ICD-10-CM | POA: Diagnosis not present

## 2024-04-14 DIAGNOSIS — E782 Mixed hyperlipidemia: Secondary | ICD-10-CM | POA: Diagnosis not present

## 2024-04-14 DIAGNOSIS — I10 Essential (primary) hypertension: Secondary | ICD-10-CM

## 2024-04-14 MED ORDER — OLMESARTAN MEDOXOMIL 20 MG PO TABS: 20.0000 mg | ORAL_TABLET | Freq: Every day | ORAL | 2 refills | Status: AC

## 2024-04-14 NOTE — Patient Instructions (Signed)
 Medication Instructions:  STOP Lisinopril  START Olmesartan  20mg  once daily  *If you need a refill on your cardiac medications before your next appointment, please call your pharmacy*  Lab Work: Your physician recommends that you return for lab work in 2 weeks for BMP  If you have labs (blood work) drawn today and your tests are completely normal, you will receive your results only by: MyChart Message (if you have MyChart) OR A paper copy in the mail If you have any lab test that is abnormal or we need to change your treatment, we will call you to review the results.  Follow-Up: At [facility], you and your health needs are our priority.  As part of our continuing mission to provide you with exceptional heart care, our providers are all part of one team.  This team includes your primary Cardiologist (physician) and Advanced Practice Providers or APPs (Physician Assistants and Nurse Practitioners) who all work together to provide you with the care you need, when you need it.  Your next appointment:   2-3 months with Advanced Hypertension Clinic   We recommend signing up for the patient portal called MyChart.  Sign up information is provided on this After Visit Summary.  MyChart is used to connect with patients for Virtual Visits (Telemedicine).  Patients are able to view lab/test results, encounter notes, upcoming appointments, etc.  Non-urgent messages can be sent to your provider as well.   To learn more about what you can do with MyChart, go to [email or web address].   Other Instructions

## 2024-04-14 NOTE — Progress Notes (Signed)
 "  Advanced Hypertension Clinic Initial Assessment:    Date:  04/14/2024   ID:  [name], DOB Aug 31, 1940, MRN [id number]  PCP:  Loreli Kins, MD  Cardiologist:  None  Nephrologist:  Referring MD: Loreli Kins, MD   CC: Hypertension  History of Present Illness:    [name] is a 84 y.o. female with a hx of HTN, hypothyroidism, sciatica, insomnia, osteoarthritis, carotid artery stenosis, aortic atherosclerosis, hyponatremia (worsened on Lasix), nonobstructive CAD, HLD with statin intolerance. Here to establish care in the Advanced Hypertension Clinic.   Family history: Dad with cardiomegaly. Mom with heart failure. Both parents with hypertension.  [name] was diagnosed with hypertension this summer. It has been difficult to control. Notes would have bouts of headaches, hot flashes when her blood pressure would elevated to SBP 180s and would have to make medication changes.  Blood pressure checked with arm cuff at home that has been checked for accuracy. she reports tobacco use previously having quite many years ago in 1969. For exercise she is limited by spinal stenosis. she more recently has been eating with a neighbor which she notes is more calorie dense. She notes adding salt due to her hyponatremia which has been longstanding. She does restrict to 1,500 mL fluid intake per day. Alcohol use socially only rarely.   While helping her daughter move had episode of profound dizziness. She had cardiac CT with 20% stenosis in LAD and echocardiogram showed normal LVEF 60-65%, gr1dd, mild MR.   Reports generalized itching in the palms or her hand and her scalp which she attributes to her Lisinopril. She notes often having unique reactions to medications.  Has been improved with reducing from 15mg  BID to 10mg  AM and 15mg  PM.  Resting heart rate in the 60s. Reports her diastolic is most often more of the problem than her systolic per her report. BP this morning  135/80.   Dr. Renda for nocturia put her on Lasix for LE edema and her sodium level dropped so it was discontinued. She does wear knee high compression stockings during the day with 20-104mmHg.   Previous antihypertensives: Hydralazine - tachycardia/panic attacks Hydrochlorothiazide - never started due to hyponatremia Spironolactone - Carvedilol - itching scalp/urinary retention/OA pain/angina  Past Medical History:  Diagnosis Date   Hyperlipidemia    Hypertension    Spinal stenosis    Thyroid  disease     Past Surgical History:  Procedure Laterality Date   THYROID  SURGERY     TUBAL LIGATION      Current Medications: Active Medications[1]   Allergies:   Celecoxib, Cephalexin, Diclofenac , Lovastatin, and Sertraline   Social History   Socioeconomic History   Marital status: Widowed    Spouse name: Not on file   Number of children: Not on file   Years of education: Not on file   Highest education level: Not on file  Occupational History   Not on file  Tobacco Use   Smoking status: Former    Passive exposure: Past   Smokeless tobacco: Not on file   Tobacco comments:    Never smoked that much  Substance and Sexual Activity   Alcohol use: Not Currently   Drug use: Never   Sexual activity: Not Currently    Birth control/protection: Post-menopausal, Surgical  Other Topics Concern   Not on file  Social History Narrative   Not on file   Social Drivers of Health   Tobacco Use: Medium Risk (04/14/2024)   Patient History  Smoking Tobacco Use: Former    Smokeless Tobacco Use: Unknown    Passive Exposure: Past  Physicist, Medical Strain: Low Risk (04/14/2024)   Overall Financial Resource Strain (CARDIA)    Difficulty of Paying Living Expenses: Not hard at all  Food Insecurity: No Food Insecurity (04/14/2024)   Epic    Worried About Programme Researcher, Broadcasting/film/video in the Last Year: Never true    Ran Out of Food in the Last Year: Never true  Transportation Needs: No  Transportation Needs (04/14/2024)   Epic    Lack of Transportation (Medical): No    Lack of Transportation (Non-Medical): No  Physical Activity: Inactive (04/14/2024)   Exercise Vital Sign    Days of Exercise per Week: 0 days    Minutes of Exercise per Session: 0 min  Stress: Stress Concern Present (04/14/2024)   [employer] of Occupational [employer] - Occupational Stress Questionnaire    Feeling of Stress: To some extent  Social Connections: Moderately Integrated (09/11/2023)   [facility]   Social Connection and Isolation Panel    In a typical week, how many times do you talk on the phone with family, friends, or neighbors?: More than three times a week    How often do you get together with friends or relatives?: More than three times a week    How often do you attend church or religious services?: More than 4 times per year    Do you belong to any clubs or organizations such as church groups, unions, fraternal or athletic groups, or school groups?: Yes    How often do you attend meetings of the clubs or organizations you belong to?: More than 4 times per year    Are you married, widowed, divorced, separated, never married, or living with a partner?: Widowed  Depression (PHQ2-9): Not on file  Alcohol Screen: Low Risk (04/14/2024)   Alcohol Screen    Last Alcohol Screening Score (AUDIT): 0  Housing: Low Risk (04/14/2024)   Epic    Unable to Pay for Housing in the Last Year: No    Number of Times Moved in the Last Year: 0    Homeless in the Last Year: No  Utilities: Not At Risk (04/14/2024)   Epic    Threatened with loss of utilities: No  Health Literacy: Adequate Health Literacy (04/14/2024)   B1300 Health Literacy    Frequency of need for help with medical instructions: Never     Family History: The patient's family history includes Heart disease in an other family member; Hyperlipidemia in an other family member; Hypertension in an other family member.  ROS:    Please see the history of present illness.     All other systems reviewed and are negative.  EKGs/Labs/Other Studies Reviewed:         Recent Labs: 08/28/2023: ALT 19; BUN 27; Creatinine, Ser 0.68; Hemoglobin 13.0; Platelets 239; Potassium 4.3; Sodium 132   Recent Lipid Panel No results found for: CHOL, TRIG, HDL, CHOLHDL, VLDL, LDLCALC, LDLDIRECT  Physical Exam:   VS:  BP (!) 150/80 (BP Location: Left Arm, Patient Position: Sitting, Cuff Size: Normal)   Pulse 73   Ht 5' 1 (1.549 m)   Wt 156 lb (70.8 kg)   SpO2 98%   BMI 29.48 kg/m  , BMI Body mass index is 29.48 kg/m.  Vitals:   04/14/24 1335 04/14/24 1347  BP: (!) 154/80 (!) 150/80  Pulse: 73   Height: 5' 1 (1.549 m)  Weight: 156 lb (70.8 kg)   SpO2: 98%   BMI (Calculated): 29.49     GENERAL:  Well appearing HEENT: Pupils equal round and reactive, fundi not visualized, oral mucosa unremarkable NECK:  No jugular venous distention, waveform within normal limits, carotid upstroke brisk and symmetric, no bruits, no thyromegaly LYMPHATICS:  No cervical adenopathy LUNGS:  Clear to auscultation bilaterally HEART:  RRR.  PMI not displaced or sustained,S1 and S2 within normal limits, no S3, no S4, no clicks, no rubs, no murmurs ABD:  Flat, positive bowel sounds normal in frequency in pitch, no bruits, no rebound, no guarding, no midline pulsatile mass, no hepatomegaly, no splenomegaly EXT:  2 plus pulses throughout, no edema, no cyanosis no clubbing SKIN:  No rashes no nodules NEURO:  Cranial nerves II through XII grossly intact, motor grossly intact throughout PSYCH:  Cognitively intact, oriented to person place and time   ASSESSMENT/PLAN:    HTN - BP not at goal <130/80. Reports itching on scalp and hands with Lisinopril, atypical side effect. Stop Lisnopril, start Olmesartan  20mg  daily BMP in 2 weeks Continue metoprolol tartrate 25mg  BID, likely will continue due to history of palpitations.  Recommend  aiming for 150 minutes of moderate intensity activity per week and following a heart healthy diet.   Discussed to monitor BP at home at least 2 hours after medications and sitting for 5-10 minutes.  Nonobstructive CAD / HLD, LDL goal <70 / Statin intolerance - Stable with no anginal symptoms. No indication for ischemic evaluation.  Prior statin tolerance. Lipid goal not addressed at this clinic visit. Continue zetia 10mg  daily, lovaza 2gBID.  Hyponatremia - longstanding. Avoid diuretic. Continue 1,500 mL sodium restriction.  Screening for Secondary Hypertension:     Relevant Labs/Studies:    Latest Ref Rng & Units 08/28/2023    8:43 PM 09/11/2006    8:47 PM  Basic Labs  Sodium 135 - 145 mmol/L 132  139   Potassium 3.5 - 5.1 mmol/L 4.3  4.7   Creatinine 0.44 - 1.00 mg/dL 9.31  9.10                     Disposition:    FU with MD/APP/PharmD in 2-3 months    Medication Adjustments/Labs and Tests Ordered: Current medicines are reviewed at length with the patient today.  Concerns regarding medicines are outlined above.  No orders of the defined types were placed in this encounter.  No orders of the defined types were placed in this encounter.    Signed, Reche GORMAN Finder, NP  04/14/2024 1:50 PM     [facility]     [1]  Current Meds  Medication Sig   acetaminophen (ACETAMINOPHEN 8 HOUR) 650 MG CR tablet Take 1,300 mg by mouth in the morning and at bedtime.   acetaminophen-codeine (TYLENOL #3) 300-30 MG tablet Take 1 tablet by mouth every 6 (six) hours as needed (For pain).   busPIRone (BUSPAR) 10 MG tablet Take 20mg  in AM, 10mg  at 2PM, and 10mg  at [facility].   Calcium-Magnesium-Zinc 333-133-5 MG TABS Take 2 tablets by mouth daily with breakfast.   celecoxib (CELEBREX) 200 MG capsule Take 200 mg by mouth 2 (two) times daily with a meal.   cetirizine (ZYRTEC) 10 MG chewable tablet Chew 10 mg by mouth daily.   Cholecalciferol (VITAMIN D) 50 MCG (2000 UT) CAPS  Take 1 capsule by mouth daily.   Cyanocobalamin  (VITAMIN B-12) 5000 MCG TBDP Take 5,000 mcg by mouth  daily.   diclofenac  Sodium (VOLTAREN ) 1 % GEL APPLY 2 GRAMS TO THE AFFECTED AREA(S) BY TOPICAL ROUTE 4 TIMES PER DAY   ezetimibe (ZETIA) 10 MG tablet Take 10 mg by mouth daily.   Glucosamine-Chondroit-Vit C-Mn (GLUCOSAMINE 1500 COMPLEX) CAPS Take 2 capsules by mouth daily.   hydrOXYzine (ATARAX) 10 MG tablet Take 10 mg by mouth daily as needed.   lidocaine (LIDODERM) 5 % Place 1 patch onto the skin daily. Remove after 12 hours.   lisinopril (ZESTRIL) 10 MG tablet 1.5 tablet in the evening and 1 tablet in the morning   melatonin (MELATONIN MAXIMUM STRENGTH) 5 MG TABS Take 5 mg by mouth.   methocarbamol (ROBAXIN) 500 MG tablet Take 500 mg by mouth 2 (two) times daily as needed.   metoprolol tartrate (LOPRESSOR) 25 MG tablet Take 25 mg by mouth 2 (two) times daily.   Multiple Vitamin (MULTIVITAMIN) tablet Take 1 tablet by mouth daily.   omega-3 acid ethyl esters (LOVAZA) 1 g capsule Take 2 g by mouth 2 (two) times daily.   pyridOXINE (VITAMIN B6) 100 MG tablet Take 100 mg by mouth daily.   SYNTHROID 100 MCG tablet Take 100 mcg by mouth daily.   tamsulosin (FLOMAX) 0.4 MG CAPS capsule Take 0.4 mg by mouth daily.   temazepam (RESTORIL) 30 MG capsule Take 30 mg by mouth at bedtime as needed.   traMADol (ULTRAM) 50 MG tablet Take 50 mg by mouth every 8 (eight) hours as needed.   triamcinolone  cream (KENALOG ) 0.5 %    VAGIFEM 10 MCG TABS vaginal tablet Place 1 tablet vaginally 2 (two) times a week.   "

## 2024-04-17 ENCOUNTER — Encounter (HOSPITAL_BASED_OUTPATIENT_CLINIC_OR_DEPARTMENT_OTHER): Payer: Self-pay

## 2024-04-20 ENCOUNTER — Other Ambulatory Visit (HOSPITAL_BASED_OUTPATIENT_CLINIC_OR_DEPARTMENT_OTHER): Payer: Self-pay | Admitting: Family

## 2024-04-26 ENCOUNTER — Other Ambulatory Visit: Payer: Self-pay | Admitting: Student

## 2024-04-26 ENCOUNTER — Other Ambulatory Visit: Payer: Self-pay | Admitting: Interventional Radiology

## 2024-04-26 DIAGNOSIS — M1711 Unilateral primary osteoarthritis, right knee: Secondary | ICD-10-CM

## 2024-05-05 ENCOUNTER — Other Ambulatory Visit

## 2024-06-13 ENCOUNTER — Encounter (HOSPITAL_BASED_OUTPATIENT_CLINIC_OR_DEPARTMENT_OTHER): Admitting: Family
